# Patient Record
Sex: Female | Born: 1935 | Race: White | Hispanic: No | Smoking: Never smoker
Health system: Southern US, Community
[De-identification: ages and names within clinical notes are randomized; demographics above are authoritative.]

## PROBLEM LIST (undated history)

## (undated) DIAGNOSIS — I509 Heart failure, unspecified: Secondary | ICD-10-CM

## (undated) DIAGNOSIS — M810 Age-related osteoporosis without current pathological fracture: Secondary | ICD-10-CM

## (undated) DIAGNOSIS — M1990 Unspecified osteoarthritis, unspecified site: Secondary | ICD-10-CM

## (undated) DIAGNOSIS — I10 Essential (primary) hypertension: Secondary | ICD-10-CM

## (undated) DIAGNOSIS — E785 Hyperlipidemia, unspecified: Secondary | ICD-10-CM

## (undated) HISTORY — PX: REPLACEMENT TOTAL KNEE: SUR1224

## (undated) HISTORY — PX: CHOLECYSTECTOMY: SHX55

## (undated) HISTORY — PX: APPENDECTOMY: SHX54

## (undated) HISTORY — PX: CARPAL TUNNEL RELEASE: SHX101

---

## 2004-06-10 ENCOUNTER — Ambulatory Visit: Payer: Self-pay | Admitting: Unknown Physician Specialty

## 2004-06-17 ENCOUNTER — Ambulatory Visit: Payer: Self-pay | Admitting: Unknown Physician Specialty

## 2004-11-24 ENCOUNTER — Ambulatory Visit: Payer: Self-pay | Admitting: Internal Medicine

## 2004-12-23 ENCOUNTER — Ambulatory Visit: Payer: Self-pay | Admitting: Unknown Physician Specialty

## 2004-12-30 ENCOUNTER — Ambulatory Visit: Payer: Self-pay | Admitting: Unknown Physician Specialty

## 2005-11-20 ENCOUNTER — Ambulatory Visit: Payer: Self-pay

## 2005-12-07 ENCOUNTER — Ambulatory Visit: Payer: Self-pay | Admitting: Unknown Physician Specialty

## 2005-12-07 ENCOUNTER — Other Ambulatory Visit: Payer: Self-pay

## 2005-12-14 ENCOUNTER — Ambulatory Visit: Payer: Self-pay | Admitting: Unknown Physician Specialty

## 2006-06-11 ENCOUNTER — Other Ambulatory Visit: Payer: Self-pay

## 2006-06-19 ENCOUNTER — Inpatient Hospital Stay: Payer: Self-pay | Admitting: Unknown Physician Specialty

## 2006-09-06 ENCOUNTER — Ambulatory Visit: Payer: Self-pay | Admitting: Internal Medicine

## 2007-03-27 ENCOUNTER — Other Ambulatory Visit: Payer: Self-pay

## 2007-03-27 ENCOUNTER — Ambulatory Visit: Payer: Self-pay | Admitting: Ophthalmology

## 2007-03-28 ENCOUNTER — Ambulatory Visit: Payer: Self-pay | Admitting: Cardiology

## 2007-04-15 ENCOUNTER — Ambulatory Visit: Payer: Self-pay | Admitting: Ophthalmology

## 2007-12-26 ENCOUNTER — Ambulatory Visit: Payer: Self-pay | Admitting: Ophthalmology

## 2008-01-06 ENCOUNTER — Ambulatory Visit: Payer: Self-pay | Admitting: Ophthalmology

## 2009-01-20 ENCOUNTER — Ambulatory Visit: Payer: Self-pay | Admitting: Internal Medicine

## 2010-02-03 ENCOUNTER — Ambulatory Visit: Payer: Self-pay | Admitting: Internal Medicine

## 2011-03-02 ENCOUNTER — Ambulatory Visit: Payer: Self-pay | Admitting: Internal Medicine

## 2012-03-04 ENCOUNTER — Ambulatory Visit: Payer: Self-pay | Admitting: Internal Medicine

## 2013-03-05 ENCOUNTER — Ambulatory Visit: Payer: Self-pay | Admitting: Internal Medicine

## 2013-11-24 ENCOUNTER — Ambulatory Visit: Payer: Self-pay | Admitting: Ophthalmology

## 2013-12-11 ENCOUNTER — Ambulatory Visit: Payer: Self-pay | Admitting: Ophthalmology

## 2013-12-23 ENCOUNTER — Ambulatory Visit: Payer: Self-pay | Admitting: Vascular Surgery

## 2014-03-30 ENCOUNTER — Ambulatory Visit: Payer: Self-pay | Admitting: Internal Medicine

## 2014-10-22 ENCOUNTER — Other Ambulatory Visit: Payer: Self-pay | Admitting: Rheumatology

## 2014-10-22 ENCOUNTER — Ambulatory Visit
Admission: RE | Admit: 2014-10-22 | Discharge: 2014-10-22 | Disposition: A | Discharge status: Home / Self Care | Payer: Medicare Other | Source: Ambulatory Visit | Attending: Rheumatology | Admitting: Rheumatology

## 2014-10-22 DIAGNOSIS — M79604 Pain in right leg: Secondary | ICD-10-CM

## 2014-10-22 DIAGNOSIS — M7989 Other specified soft tissue disorders: Secondary | ICD-10-CM | POA: Diagnosis not present

## 2015-02-22 ENCOUNTER — Other Ambulatory Visit: Payer: Self-pay | Admitting: Internal Medicine

## 2015-02-22 DIAGNOSIS — Z1231 Encounter for screening mammogram for malignant neoplasm of breast: Secondary | ICD-10-CM

## 2015-04-01 ENCOUNTER — Other Ambulatory Visit: Payer: Self-pay | Admitting: Internal Medicine

## 2015-04-01 ENCOUNTER — Ambulatory Visit
Admission: RE | Admit: 2015-04-01 | Discharge: 2015-04-01 | Disposition: A | Discharge status: Home / Self Care | Payer: Medicare Other | Source: Ambulatory Visit | Attending: Internal Medicine | Admitting: Internal Medicine

## 2015-04-01 DIAGNOSIS — Z1231 Encounter for screening mammogram for malignant neoplasm of breast: Secondary | ICD-10-CM

## 2016-02-04 ENCOUNTER — Emergency Department: Payer: Medicare Other

## 2016-02-04 ENCOUNTER — Observation Stay
Admission: EM | Admit: 2016-02-04 | Discharge: 2016-02-08 | Disposition: A | Discharge status: Home / Self Care | Payer: Medicare Other | Attending: Family Medicine | Admitting: Family Medicine

## 2016-02-04 ENCOUNTER — Encounter: Payer: Self-pay | Admitting: Emergency Medicine

## 2016-02-04 DIAGNOSIS — M810 Age-related osteoporosis without current pathological fracture: Secondary | ICD-10-CM | POA: Insufficient documentation

## 2016-02-04 DIAGNOSIS — Z7982 Long term (current) use of aspirin: Secondary | ICD-10-CM | POA: Diagnosis not present

## 2016-02-04 DIAGNOSIS — Z9889 Other specified postprocedural states: Secondary | ICD-10-CM | POA: Diagnosis not present

## 2016-02-04 DIAGNOSIS — M1990 Unspecified osteoarthritis, unspecified site: Secondary | ICD-10-CM | POA: Diagnosis not present

## 2016-02-04 DIAGNOSIS — S42309A Unspecified fracture of shaft of humerus, unspecified arm, initial encounter for closed fracture: Secondary | ICD-10-CM | POA: Diagnosis present

## 2016-02-04 DIAGNOSIS — E785 Hyperlipidemia, unspecified: Secondary | ICD-10-CM | POA: Insufficient documentation

## 2016-02-04 DIAGNOSIS — I708 Atherosclerosis of other arteries: Secondary | ICD-10-CM | POA: Diagnosis not present

## 2016-02-04 DIAGNOSIS — Z96652 Presence of left artificial knee joint: Secondary | ICD-10-CM | POA: Insufficient documentation

## 2016-02-04 DIAGNOSIS — W010XXA Fall on same level from slipping, tripping and stumbling without subsequent striking against object, initial encounter: Secondary | ICD-10-CM | POA: Insufficient documentation

## 2016-02-04 DIAGNOSIS — Z833 Family history of diabetes mellitus: Secondary | ICD-10-CM | POA: Insufficient documentation

## 2016-02-04 DIAGNOSIS — Z79899 Other long term (current) drug therapy: Secondary | ICD-10-CM | POA: Insufficient documentation

## 2016-02-04 DIAGNOSIS — I509 Heart failure, unspecified: Secondary | ICD-10-CM | POA: Diagnosis not present

## 2016-02-04 DIAGNOSIS — Z9049 Acquired absence of other specified parts of digestive tract: Secondary | ICD-10-CM | POA: Diagnosis not present

## 2016-02-04 DIAGNOSIS — W19XXXA Unspecified fall, initial encounter: Secondary | ICD-10-CM

## 2016-02-04 DIAGNOSIS — S52209A Unspecified fracture of shaft of unspecified ulna, initial encounter for closed fracture: Secondary | ICD-10-CM | POA: Diagnosis present

## 2016-02-04 DIAGNOSIS — Z823 Family history of stroke: Secondary | ICD-10-CM | POA: Diagnosis not present

## 2016-02-04 DIAGNOSIS — S32599A Other specified fracture of unspecified pubis, initial encounter for closed fracture: Secondary | ICD-10-CM | POA: Diagnosis present

## 2016-02-04 DIAGNOSIS — S32512A Fracture of superior rim of left pubis, initial encounter for closed fracture: Secondary | ICD-10-CM | POA: Diagnosis not present

## 2016-02-04 DIAGNOSIS — S42201A Unspecified fracture of upper end of right humerus, initial encounter for closed fracture: Secondary | ICD-10-CM | POA: Diagnosis present

## 2016-02-04 DIAGNOSIS — I10 Essential (primary) hypertension: Secondary | ICD-10-CM | POA: Diagnosis present

## 2016-02-04 DIAGNOSIS — S52134A Nondisplaced fracture of neck of right radius, initial encounter for closed fracture: Secondary | ICD-10-CM | POA: Insufficient documentation

## 2016-02-04 DIAGNOSIS — S52001A Unspecified fracture of upper end of right ulna, initial encounter for closed fracture: Secondary | ICD-10-CM | POA: Diagnosis present

## 2016-02-04 DIAGNOSIS — I110 Hypertensive heart disease with heart failure: Secondary | ICD-10-CM | POA: Insufficient documentation

## 2016-02-04 DIAGNOSIS — R0602 Shortness of breath: Secondary | ICD-10-CM

## 2016-02-04 DIAGNOSIS — I272 Other secondary pulmonary hypertension: Secondary | ICD-10-CM | POA: Diagnosis not present

## 2016-02-04 DIAGNOSIS — S329XXA Fracture of unspecified parts of lumbosacral spine and pelvis, initial encounter for closed fracture: Secondary | ICD-10-CM

## 2016-02-04 HISTORY — DX: Essential (primary) hypertension: I10

## 2016-02-04 HISTORY — DX: Unspecified osteoarthritis, unspecified site: M19.90

## 2016-02-04 HISTORY — DX: Age-related osteoporosis without current pathological fracture: M81.0

## 2016-02-04 HISTORY — DX: Heart failure, unspecified: I50.9

## 2016-02-04 HISTORY — DX: Hyperlipidemia, unspecified: E78.5

## 2016-02-04 LAB — CBC WITH DIFFERENTIAL/PLATELET
BASOS ABS: 0 10*3/uL (ref 0–0.1)
Basophils Relative: 0 %
Eosinophils Absolute: 0 10*3/uL (ref 0–0.7)
Eosinophils Relative: 0 %
HEMATOCRIT: 35.8 % (ref 35.0–47.0)
HEMOGLOBIN: 12.4 g/dL (ref 12.0–16.0)
LYMPHS ABS: 0.5 10*3/uL — AB (ref 1.0–3.6)
LYMPHS PCT: 3 %
MCH: 29.6 pg (ref 26.0–34.0)
MCHC: 34.7 g/dL (ref 32.0–36.0)
MCV: 85.3 fL (ref 80.0–100.0)
Monocytes Absolute: 1 10*3/uL — ABNORMAL HIGH (ref 0.2–0.9)
Monocytes Relative: 6 %
NEUTROS ABS: 14 10*3/uL — AB (ref 1.4–6.5)
Neutrophils Relative %: 91 %
PLATELETS: 148 10*3/uL — AB (ref 150–440)
RBC: 4.2 MIL/uL (ref 3.80–5.20)
RDW: 14.9 % — ABNORMAL HIGH (ref 11.5–14.5)
WBC: 15.5 10*3/uL — AB (ref 3.6–11.0)

## 2016-02-04 LAB — BASIC METABOLIC PANEL
ANION GAP: 8 (ref 5–15)
BUN: 35 mg/dL — AB (ref 6–20)
CHLORIDE: 108 mmol/L (ref 101–111)
CO2: 24 mmol/L (ref 22–32)
Calcium: 8.9 mg/dL (ref 8.9–10.3)
Creatinine, Ser: 1.13 mg/dL — ABNORMAL HIGH (ref 0.44–1.00)
GFR calc Af Amer: 49 mL/min — ABNORMAL LOW (ref >60)
GFR, EST NON AFRICAN AMERICAN: 42 mL/min — AB (ref >60)
GLUCOSE: 183 mg/dL — AB (ref 65–99)
POTASSIUM: 3.8 mmol/L (ref 3.5–5.1)
Sodium: 140 mmol/L (ref 135–145)

## 2016-02-04 LAB — TYPE AND SCREEN
ABO/RH(D): A POS
Antibody Screen: NEGATIVE

## 2016-02-04 MED ORDER — MORPHINE SULFATE (PF) 2 MG/ML IV SOLN
2.0000 mg | Freq: Once | INTRAVENOUS | Status: AC
  Administered 2016-02-04: 2 mg via INTRAVENOUS
  Filled 2016-02-04: qty 1

## 2016-02-04 MED ORDER — MORPHINE SULFATE (PF) 4 MG/ML IV SOLN
INTRAVENOUS | Status: AC
  Administered 2016-02-04: 4 mg via INTRAVENOUS
  Filled 2016-02-04: qty 1

## 2016-02-04 MED ORDER — MORPHINE SULFATE (PF) 4 MG/ML IV SOLN
4.0000 mg | Freq: Once | INTRAVENOUS | Status: AC
  Administered 2016-02-04: 4 mg via INTRAVENOUS

## 2016-02-04 NOTE — ED Notes (Signed)
Pt back from x-ray.

## 2016-02-04 NOTE — ED Triage Notes (Signed)
Pt to triage via wheelchair. Pt reports she was in the yard when she slipped on the grass and fall landing on her right arm. Pt reports while getting herself off the ground she developed pain in her left groin. Pt reports pain to her right arm from the elbow up to the shoulder and in her left groin region. Pt denies LOC.

## 2016-02-04 NOTE — ED Provider Notes (Signed)
[facility] Emergency Department Provider Note  ____________________________________________   First MD Initiated Contact with Patient 02/04/16 2030     (approximate)  I have reviewed the triage vital signs and the nursing notes.   HISTORY  Chief Complaint Fall; Arm Injury; and Groin Pain   HPI Yvonne Knight is a 82 y.o. female with a history of CHF, hypertension and arthritis was presented to emergency department after fall. Said that she slipped on the grass in her yard and fell onto her right side. She denies hitting her head or losing consciousness. She is complaining of pain in her right shoulder and right elbow as well as her right forearm. She also is complaining of pain to her left pelvis. She says that she got up and was able to walk into her house after she fell. However, as time went on she began to have more pain and was unable to walk. She is now presenting to the emergency room with her son at her bedside. Says that aspirin is the only anticoagulant that she takes home.   Past Medical History:  Diagnosis Date  . Arthritis   . CHF (congestive heart failure) (HCC)   . Hypertension     There are no active problems to display for this patient.   History reviewed. No pertinent surgical history.  Prior to Admission medications   Not on File    Allergies Review of patient's allergies indicates no known allergies.  History reviewed. No pertinent family history.  Social History Social History  Substance Use Topics  . Smoking status: Never Smoker  . Smokeless tobacco: Never Used  . Alcohol use No    Review of Systems Constitutional: No fever/chills Eyes: No visual changes. ENT: No sore throat. Cardiovascular: Denies chest pain. Respiratory: Denies shortness of breath. Gastrointestinal: No abdominal pain.  No nausea, no vomiting.  No diarrhea.  No constipation. Genitourinary: Negative for dysuria. Musculoskeletal: Negative  for back pain. Skin: Negative for rash. Neurological: Negative for headaches, focal weakness or numbness.  10-point ROS otherwise negative.  ____________________________________________   PHYSICAL EXAM:  VITAL SIGNS: ED Triage Vitals  Enc Vitals Group     BP 02/04/16 1958 (!) 130/47     Pulse Rate 02/04/16 1958 88     Resp 02/04/16 1958 18     Temp 02/04/16 1958 98 F (36.7 C)     Temp Source 02/04/16 1958 Oral     SpO2 02/04/16 1958 99 %     Weight 02/04/16 1959 157 lb (71.2 kg)     Height 02/04/16 1959 4\' 11"  (1.499 m)     Head Circumference --      Peak Flow --      Pain Score 02/04/16 1959 10     Pain Loc --      Pain Edu? --      Excl. in GC? --     Constitutional: Alert and oriented. Well appearing and in no acute distress. Eyes: Conjunctivae are normal. PERRL. EOMI. Head: Atraumatic. Nose: No congestion/rhinnorhea. Mouth/Throat: Mucous membranes are moist.   Neck: No stridor.   Cardiovascular: Normal rate, regular rhythm. Grossly normal heart sounds.  Good peripheral circulation. Respiratory: Normal respiratory effort.  No retractions. Lungs CTAB. Gastrointestinal: Soft and nontender. No distention.  Musculoskeletal: Right shoulder with swelling and tenderness palpation. Sensation is intact to light touch. Left elbow tender diffusely without any palpable large effusion. Tenderness also down the right forearm with snuffbox tenderness as well as pain to  axial load of the right thumb. Patient with 5 out of 5 grip strength as well as sensation being intact to light touch and intact right-sided radial pulse. Pelvis stable. However, tenderness to palpation over the left anterior groin. Able to passively range the bilateral hips fully without any resistance or pain.  No lower extremity tenderness nor edema.  No joint effusions. Neurologic:  Normal speech and language. No gross focal neurologic deficits are appreciated. Skin:  Skin is warm, dry and intact. No rash  noted. Psychiatric: Mood and affect are normal. Speech and behavior are normal.  ____________________________________________   LABS (all labs ordered are listed, but only abnormal results are displayed)  Labs Reviewed  CBC WITH DIFFERENTIAL/PLATELET  BASIC METABOLIC PANEL  TYPE AND SCREEN   ____________________________________________  EKG   ____________________________________________  Purnell Shoemaker Forearm Right (Accession [id number]) (Order [id number])  Imaging  Date: 02/04/2016 Department: [facility] EMERGENCY DEPARTMENT Released By: Chelsea Aus, RN (auto-released) Authorizing: Myrna Blazer, MD  PACS Images   Show images for DG Forearm Right  Addendum   ADDENDUM REPORT: 02/04/2016 21:23  ADDENDUM: The fracture at the proximal metaphysis-diaphysis junction is in the proximal ulna. No humeral fracture evident. Old trauma involving the proximal radius with questionable superimposed fracture in the proximal radial metaphysis.   Electronically Signed   By: Bretta Bang III M.D.   On: 02/04/2016 21:23   Addended by Bretta Bang III, MD on 02/04/2016 9:26 PM    Study Result   CLINICAL DATA:  Pain following fall  EXAM: RIGHT FOREARM - 2 VIEW  COMPARISON:  None.  FINDINGS: Frontal and lateral views were obtained. There is an acute appearing fracture of the proximal humerus at the proximal metaphysis -diaphysis junction. There is evidence of old trauma with remodeling in the proximal radial metaphysis. There may be an acute fracture superimposed in this area. There is evidence of an apparent old fracture of the distal radial metaphysis. There is evidence of an old healed fracture of the distal radial metaphysis. No other fractures are evident. No dislocation. There is a cyst in the lunate bone. There is soft tissue swelling in the proximal forearm.  IMPRESSION: Acute fracture proximal radial metaphysis  -diaphysis junction. Old trauma proximal radial metaphysis with questionable superimposed acute fracture. Old healed fracture distal radius. Soft tissue swelling proximal forearm. No dislocation.  Electronically Signed: By: Bretta Bang III M.D. On: 02/04/2016 21:18    DG Hip Unilat With Pelvis 2-3 Views Left (Accession [id number]) (Order [id number])  Imaging  Date: 02/04/2016 Department: [facility] EMERGENCY DEPARTMENT Released By: Chelsea Aus, RN (auto-released) Authorizing: Myrna Blazer, MD  PACS Images   Show images for DG Hip Unilat With Pelvis 2-3 Views Left  Study Result   CLINICAL DATA:  Pain following fall  EXAM: DG HIP (WITH OR WITHOUT PELVIS) 2-3V LEFT  COMPARISON:  None.  FINDINGS: Frontal pelvis as well as frontal and lateral left hip images were obtained. There is a comminuted fracture of the left superior pubic ramus with fracture fragments in near anatomic alignment. No other fractures are evident. No dislocation. There is mild symmetric narrowing of both hip joints. Bones are osteoporotic. There are foci of atherosclerotic calcification in the superior femoral arteries bilaterally.  IMPRESSION: Comminuted fracture left superior pubic ramus with fracture fragments in near anatomic alignment. No other fractures are evident. No apparent dislocation. There is mild symmetric narrowing of both hip joints. Bones are diffusely osteoporotic. There is  superficial femoral artery atherosclerosis bilaterally.   Electronically Signed   By: Bretta Bang III M.D.   On: 02/04/2016 21:16  DG Humerus Right (Accession [id number]) (Order [id number])  Imaging  Date: 02/04/2016 Department: [facility] EMERGENCY DEPARTMENT Released By: Chelsea Aus, RN (auto-released) Authorizing: Myrna Blazer, MD  PACS Images   Show images for DG Humerus Right  Study Result   CLINICAL DATA:  Pain  following fall  EXAM: RIGHT HUMERUS - 2+ VIEW  COMPARISON:  None.  FINDINGS: Frontal and lateral views obtained. There is an acute fracture of the proximal ulnar metaphysis-diaphysis junction. Alignment appears near anatomic. There is evidence of old trauma with probable acute superimposed fracture in the proximal radial metaphysis. There is an impacted fracture of the proximal humeral metaphysis. No other fractures. No dislocations. There is moderate osteoarthritic change in the shoulder and elbow joints.  IMPRESSION: Impacted fracture of the proximal right humeral metaphysis. Fractures in the proximal forearm. Moderate osteoarthritic change in the elbow and right shoulder joints. Bones appear somewhat osteoporotic.   Electronically Signed   By: Bretta Bang III M.D.   On: 02/04/2016 21:20       ____________________________________________   PROCEDURES  Procedure(s) performed:   Procedures  Critical Care performed:   ____________________________________________   INITIAL IMPRESSION / ASSESSMENT AND PLAN / ED COURSE  Pertinent labs & imaging results that were available during my care of the patient were reviewed by me and considered in my medical decision making (see chart for details).  ----------------------------------------- 10:09 PM on 02/04/2016 -----------------------------------------  Patient with multiple fractures. Discussed with Dr. Trilby Drummer of orthopedics. I discussed with the with him including a sugar tong as well as spica splint of the right forearm and a sling to immobilize the right shoulder. Pending dedicated right wrist films at this time as well. I reviewed the imaging results with Dr. Trilby Drummer and he is questioning whether any of the fractures would require any surgical fixation. He recommends admission to the medicine service. He will see the patient has a consult. This plan with patient as well as the patient's son is at the  bedside. They're understanding of the plan and willing to comply. Patient to be given morphine for what she is describing is 10 out of 10 pain right now. She is not showing any signs of acute distress and is calm when I'm interviewing her. Signed over Dr. Anne Hahn.  Clinical Course     ____________________________________________   FINAL CLINICAL IMPRESSION(S) / ED DIAGNOSES  Right proximal humerus fracture. Right ulnar fracture. Left-sided pubic ramus fracture.    NEW MEDICATIONS STARTED DURING THIS VISIT:  New Prescriptions   No medications on file     Note:  This document was prepared using Dragon voice recognition software and may include unintentional dictation errors.    Myrna Blazer, MD 02/04/16 [phone]

## 2016-02-05 ENCOUNTER — Encounter: Payer: Self-pay | Admitting: Internal Medicine

## 2016-02-05 DIAGNOSIS — S42309A Unspecified fracture of shaft of humerus, unspecified arm, initial encounter for closed fracture: Secondary | ICD-10-CM | POA: Diagnosis present

## 2016-02-05 DIAGNOSIS — S32599A Other specified fracture of unspecified pubis, initial encounter for closed fracture: Secondary | ICD-10-CM | POA: Diagnosis present

## 2016-02-05 DIAGNOSIS — W19XXXA Unspecified fall, initial encounter: Secondary | ICD-10-CM

## 2016-02-05 DIAGNOSIS — S52001A Unspecified fracture of upper end of right ulna, initial encounter for closed fracture: Secondary | ICD-10-CM | POA: Diagnosis not present

## 2016-02-05 DIAGNOSIS — I10 Essential (primary) hypertension: Secondary | ICD-10-CM | POA: Diagnosis present

## 2016-02-05 DIAGNOSIS — S52209A Unspecified fracture of shaft of unspecified ulna, initial encounter for closed fracture: Secondary | ICD-10-CM | POA: Diagnosis present

## 2016-02-05 LAB — BASIC METABOLIC PANEL
Anion gap: 5 (ref 5–15)
BUN: 31 mg/dL — AB (ref 6–20)
CHLORIDE: 107 mmol/L (ref 101–111)
CO2: 28 mmol/L (ref 22–32)
Calcium: 8.7 mg/dL — ABNORMAL LOW (ref 8.9–10.3)
Creatinine, Ser: 0.98 mg/dL (ref 0.44–1.00)
GFR calc Af Amer: 58 mL/min — ABNORMAL LOW (ref >60)
GFR calc non Af Amer: 50 mL/min — ABNORMAL LOW (ref >60)
Glucose, Bld: 178 mg/dL — ABNORMAL HIGH (ref 65–99)
POTASSIUM: 3.9 mmol/L (ref 3.5–5.1)
SODIUM: 140 mmol/L (ref 135–145)

## 2016-02-05 LAB — CBC
HEMATOCRIT: 34.1 % — AB (ref 35.0–47.0)
Hemoglobin: 12 g/dL (ref 12.0–16.0)
MCH: 29.8 pg (ref 26.0–34.0)
MCHC: 35.2 g/dL (ref 32.0–36.0)
MCV: 84.7 fL (ref 80.0–100.0)
Platelets: 140 10*3/uL — ABNORMAL LOW (ref 150–440)
RBC: 4.03 MIL/uL (ref 3.80–5.20)
RDW: 14.9 % — AB (ref 11.5–14.5)
WBC: 10 10*3/uL (ref 3.6–11.0)

## 2016-02-05 MED ORDER — MORPHINE SULFATE (PF) 4 MG/ML IV SOLN
4.0000 mg | INTRAVENOUS | Status: DC | PRN
  Administered 2016-02-05: 4 mg via INTRAVENOUS
  Filled 2016-02-05: qty 1

## 2016-02-05 MED ORDER — AMLODIPINE BESYLATE 5 MG PO TABS
10.0000 mg | ORAL_TABLET | Freq: Every day | ORAL | Status: DC
  Administered 2016-02-05 – 2016-02-08 (×4): 10 mg via ORAL
  Filled 2016-02-05 (×4): qty 2

## 2016-02-05 MED ORDER — ENOXAPARIN SODIUM 40 MG/0.4ML ~~LOC~~ SOLN
40.0000 mg | SUBCUTANEOUS | Status: DC
  Administered 2016-02-05 – 2016-02-07 (×3): 40 mg via SUBCUTANEOUS
  Filled 2016-02-05 (×3): qty 0.4

## 2016-02-05 MED ORDER — HYDROXYCHLOROQUINE SULFATE 200 MG PO TABS
200.0000 mg | ORAL_TABLET | Freq: Every day | ORAL | Status: DC
  Administered 2016-02-05 – 2016-02-08 (×4): 200 mg via ORAL
  Filled 2016-02-05 (×4): qty 1

## 2016-02-05 MED ORDER — ACETAMINOPHEN 650 MG RE SUPP: 650.0000 mg | Freq: Four times a day (QID) | RECTAL | Status: DC | PRN

## 2016-02-05 MED ORDER — SODIUM CHLORIDE 0.9 % IV SOLN
INTRAVENOUS | Status: AC
  Administered 2016-02-05: 02:00:00 via INTRAVENOUS

## 2016-02-05 MED ORDER — ONDANSETRON HCL 4 MG PO TABS: 4.0000 mg | ORAL_TABLET | Freq: Four times a day (QID) | ORAL | Status: DC | PRN

## 2016-02-05 MED ORDER — FUROSEMIDE 20 MG PO TABS
20.0000 mg | ORAL_TABLET | Freq: Every day | ORAL | Status: DC
  Administered 2016-02-05 – 2016-02-08 (×4): 20 mg via ORAL
  Filled 2016-02-05 (×4): qty 1

## 2016-02-05 MED ORDER — OXYCODONE HCL 5 MG PO TABS
5.0000 mg | ORAL_TABLET | ORAL | Status: DC | PRN
  Administered 2016-02-05 (×2): 5 mg via ORAL
  Filled 2016-02-05 (×2): qty 1

## 2016-02-05 MED ORDER — ACETAMINOPHEN 325 MG PO TABS
650.0000 mg | ORAL_TABLET | Freq: Four times a day (QID) | ORAL | Status: DC | PRN
Start: 2016-02-05 — End: 2016-02-08

## 2016-02-05 MED ORDER — MORPHINE SULFATE (PF) 2 MG/ML IV SOLN: 2.0000 mg | INTRAVENOUS | Status: DC | PRN

## 2016-02-05 MED ORDER — OXYCODONE HCL 5 MG PO TABS
5.0000 mg | ORAL_TABLET | ORAL | Status: DC | PRN
  Administered 2016-02-05 (×2): 5 mg via ORAL
  Administered 2016-02-06 – 2016-02-08 (×6): 10 mg via ORAL
  Administered 2016-02-08: 5 mg via ORAL
  Administered 2016-02-08: 10 mg via ORAL
  Filled 2016-02-05 (×3): qty 2
  Filled 2016-02-05 (×4): qty 1
  Filled 2016-02-05: qty 2
  Filled 2016-02-05: qty 1
  Filled 2016-02-05 (×2): qty 2

## 2016-02-05 MED ORDER — ONDANSETRON HCL 4 MG/2ML IJ SOLN: 4.0000 mg | Freq: Four times a day (QID) | INTRAMUSCULAR | Status: DC | PRN

## 2016-02-05 MED ORDER — ENOXAPARIN SODIUM 40 MG/0.4ML ~~LOC~~ SOLN: 40.0000 mg | SUBCUTANEOUS | Status: DC

## 2016-02-05 MED ORDER — ENOXAPARIN SODIUM 30 MG/0.3ML ~~LOC~~ SOLN: 30.0000 mg | SUBCUTANEOUS | Status: DC

## 2016-02-05 MED ORDER — LOSARTAN POTASSIUM 50 MG PO TABS
50.0000 mg | ORAL_TABLET | Freq: Every day | ORAL | Status: DC
  Administered 2016-02-05 – 2016-02-08 (×4): 50 mg via ORAL
  Filled 2016-02-05 (×4): qty 1

## 2016-02-05 MED ORDER — ASPIRIN EC 325 MG PO TBEC
325.0000 mg | DELAYED_RELEASE_TABLET | Freq: Every day | ORAL | Status: DC
  Administered 2016-02-05 – 2016-02-08 (×4): 325 mg via ORAL
  Filled 2016-02-05 (×4): qty 1

## 2016-02-05 NOTE — Consult Note (Signed)
82 year old female fell at home last night she tripped over something and landed on her right side and left side. Her right upper extremity was injured and had x-rays in the emergency room and also suffered left-sided pelvic fracture. 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 She has not a pain with logrolling on the left lower leg motion was not tested in the right upper extremity secondary to fractures  X-rays were reviewed. There is nondisplaced fractures of the proximal ulna and radial neck as well as a proximal humerus fracture that appears impacted on the right side. On the left side there is a superior pubic ramus fracture as well without extension into the joint.  Clinical impression is multiple fractures both right upper and left lower extremities  Plan physical therapy's been requested to need to stay nonweightbearing on her right upper extremity for at least 6 weeks. She will prior require short-term rehabilitation to be able to manage and get mobilized with these fractures

## 2016-02-05 NOTE — Progress Notes (Signed)
Verbal order by MD Rosita Kea, to change IV Morphine Q4 PRN  to 2 mg, and oxycodone 5-10 mg Q 4 PRN

## 2016-02-05 NOTE — Clinical Social Work Note (Addendum)
Clinical Social Work Assessment  Patient Details  Name: Yvonne Knight MRN: [id number] Date of Birth: 01/13/1926  Date of referral:  02/05/16               Reason for consult:  Facility Placement                Permission sought to share information with:  Oceanographer granted to share information::  Yes, Verbal Permission Granted  Name::        Agency::     Relationship::     Contact Information:     Housing/Transportation Living arrangements for the past 2 months:  Single Family Home Source of Information:  Patient, Adult Children Patient Interpreter Needed:  None Criminal Activity/Legal Involvement Pertinent to Current Situation/Hospitalization:  No - Comment as needed Significant Relationships:  Adult Children, Community Support Lives with:  Self Do you feel safe going back to the place where you live?  Yes Need for family participation in patient care:  No (Coment)  Care giving concerns:  STR placement, Placement preference in [facility]   Social Worker assessment / plan:  Patient was resting comfortable, and her son Joni Fears and his wife were at bedside. The CSW introduced self and role in care. The patient's son indicated that the preference for STR placement is [facility] in [place], with no secondary preference indicated. The CSW explained that a referral would be sent to all locations in [place] in case no beds available at [facility]. Patient's family indicated that they understood, and the patient and family gave verbal permission for a bed search.   At baseline, the patient is independent in all ADLs and IADLs and lives alone in a single family home. The patient has used [facility] for a past need due to a knee replacement. From that rehab stint, the patient still has a cane and walker as DME.  CSW attempted to contact [facility] for referral information and was told to call the next day. In addition, the patient has a PASRR, but her information is  incorrect in the [facility] MUST system. Primary CSW will be informed to rectify when able to do so during regular business hours.  Employment status:  Retired Health and safety inspector:  Medicare PT Recommendations:  Not assessed at this time Information / Referral to community resources:  Skilled Nursing Facility  Patient/Family's Response to care:  Family was involved and thanked CSW for her assistance.  Patient/Family's Understanding of and Emotional Response to Diagnosis, Current Treatment, and Prognosis:  Patient began showing signs of medication induced confusion. The family members were able to verbalize in their own terms the dc plan and care needs.  Emotional Assessment Appearance:  Appears stated age Attitude/Demeanor/Rapport:  Lethargic, Other (Patient showing signs of medication induced confusion) Affect (typically observed):  Accepting, Appropriate, Happy Orientation:  Oriented to Self, Oriented to Place, Oriented to  Time, Oriented to Situation Alcohol / Substance use:  Never Used Psych involvement (Current and /or in the community):  No (Comment)  Discharge Needs  Concerns to be addressed:  Discharge Planning Concerns Readmission within the last 30 days:  No Current discharge risk:  None Barriers to Discharge:  Continued Medical Work up   [employer], LCSW 02/05/2016, 3:42 PM

## 2016-02-05 NOTE — Progress Notes (Signed)
Subjective: Complains of right arm pain.  Objective: Vital signs in last 24 hours: Temp:  [98 F (36.7 C)-98.7 F (37.1 C)] 98.7 F (37.1 C) (09/23 0722) Pulse Rate:  [77-88] 85 (09/23 0722) Resp:  [17-18] 18 (09/23 0722) BP: (112-144)/(43-63) 137/52 (09/23 0722) SpO2:  [94 %-99 %] 97 % (09/23 0722) Weight:  [71.2 kg (157 lb)-71.7 kg (158 lb)] 71.7 kg (158 lb) (09/23 0500) Weight change:  Last BM Date: 02/04/16  Intake/Output from previous day: 09/22 0701 - 09/23 0700 In: 70 [I.V.:70] Out: -  Intake/Output this shift: No intake/output data recorded.  General appearance: alert, cooperative and no distress Resp: clear to auscultation bilaterally and normal percussion bilaterally Cardio: regular rate and rhythm, S1, S2 normal, no murmur, click, rub or gallop Extremities: Right arm in sling and splint  Lab Results:  Recent Labs  02/04/16 2152 02/05/16 0335  WBC 15.5* 10.0  HGB 12.4 12.0  HCT 35.8 34.1*  PLT 148* 140*   BMET  Recent Labs  02/04/16 2152 02/05/16 0335  NA 140 140  K 3.8 3.9  CL 108 107  CO2 24 28  GLUCOSE 183* 178*  BUN 35* 31*  CREATININE 1.13* 0.98  CALCIUM 8.9 8.7*    Studies/Results: Dg Forearm Right  Addendum Date: 02/04/2016   ADDENDUM REPORT: 02/04/2016 21:23 ADDENDUM: The fracture at the proximal metaphysis-diaphysis junction is in the proximal ulna. No humeral fracture evident. Old trauma involving the proximal radius with questionable superimposed fracture in the proximal radial metaphysis. Electronically Signed   By: Bretta Bang III M.D.   On: 02/04/2016 21:23   Result Date: 02/04/2016 CLINICAL DATA:  Pain following fall EXAM: RIGHT FOREARM - 2 VIEW COMPARISON:  None. FINDINGS: Frontal and lateral views were obtained. There is an acute appearing fracture of the proximal humerus at the proximal metaphysis -diaphysis junction. There is evidence of old trauma with remodeling in the proximal radial metaphysis. There may be an acute  fracture superimposed in this area. There is evidence of an apparent old fracture of the distal radial metaphysis. There is evidence of an old healed fracture of the distal radial metaphysis. No other fractures are evident. No dislocation. There is a cyst in the lunate bone. There is soft tissue swelling in the proximal forearm. IMPRESSION: Acute fracture proximal radial metaphysis -diaphysis junction. Old trauma proximal radial metaphysis with questionable superimposed acute fracture. Old healed fracture distal radius. Soft tissue swelling proximal forearm. No dislocation. Electronically Signed: By: Bretta Bang III M.D. On: 02/04/2016 21:18   Dg Wrist Complete Right  Result Date: 02/04/2016 CLINICAL DATA:  Wrist pain after fall. EXAM: RIGHT WRIST - COMPLETE 3+ VIEW COMPARISON:  None. (RIGHT hand report dated November 03, 2013 reviewed though images were unable to be obtained) FINDINGS: No acute fracture deformity or dislocation. Old first metacarpal fracture deformity. Moderate first metacarpal carpal joint space narrowing, periventricular sclerosis consistent with osteoarthrosis. Osteopenia. No destructive bony lesions. Soft tissue planes are nonsuspicious. IMPRESSION: No acute fracture deformity or dislocation. Please note, osteopenia decreases sensitivity for acute nondisplaced fractures. Old first metacarpal fracture. Electronically Signed   By: Awilda Metro M.D.   On: 02/04/2016 22:46   Dg Humerus Right  Result Date: 02/04/2016 CLINICAL DATA:  Pain following fall EXAM: RIGHT HUMERUS - 2+ VIEW COMPARISON:  None. FINDINGS: Frontal and lateral views obtained. There is an acute fracture of the proximal ulnar metaphysis-diaphysis junction. Alignment appears near anatomic. There is evidence of old trauma with probable acute superimposed fracture in the proximal  radial metaphysis. There is an impacted fracture of the proximal humeral metaphysis. No other fractures. No dislocations. There is moderate  osteoarthritic change in the shoulder and elbow joints. IMPRESSION: Impacted fracture of the proximal right humeral metaphysis. Fractures in the proximal forearm. Moderate osteoarthritic change in the elbow and right shoulder joints. Bones appear somewhat osteoporotic. Electronically Signed   By: Bretta Bang III M.D.   On: 02/04/2016 21:20   Dg Hip Unilat With Pelvis 2-3 Views Left  Result Date: 02/04/2016 CLINICAL DATA:  Pain following fall EXAM: DG HIP (WITH OR WITHOUT PELVIS) 2-3V LEFT COMPARISON:  None. FINDINGS: Frontal pelvis as well as frontal and lateral left hip images were obtained. There is a comminuted fracture of the left superior pubic ramus with fracture fragments in near anatomic alignment. No other fractures are evident. No dislocation. There is mild symmetric narrowing of both hip joints. Bones are osteoporotic. There are foci of atherosclerotic calcification in the superior femoral arteries bilaterally. IMPRESSION: Comminuted fracture left superior pubic ramus with fracture fragments in near anatomic alignment. No other fractures are evident. No apparent dislocation. There is mild symmetric narrowing of both hip joints. Bones are diffusely osteoporotic. There is superficial femoral artery atherosclerosis bilaterally. Electronically Signed   By: Bretta Bang III M.D.   On: 02/04/2016 21:16    Medications: I have reviewed the patient's current medications.  Assessment/Plan: 1.Humerus fracture - Ortho consulted. Pain meds and splint for now.  2. Pubic ramus fracture -PT as soon as can tolerate. 3. Ulnar fracture - as above 4. HTN  - Controlled on home meds home meds  Time spent 20 min  LOS: 0 days   Gracelyn Nurse 02/05/2016, 11:37 AM

## 2016-02-05 NOTE — H&P (Signed)
[facility] - [place] at [facility]   PATIENT NAME: Yvonne Knight    MR#:  [id number]  DATE OF BIRTH:  [birth year]  DATE OF ADMISSION:  02/04/2016  PRIMARY CARE PHYSICIAN: Barbette Reichmann, MD   REQUESTING/REFERRING PHYSICIAN: Pershing Proud, MD  CHIEF COMPLAINT:   Chief Complaint  Patient presents with  . Fall  . Arm Injury  . Groin Pain    HISTORY OF PRESENT ILLNESS:  Yvonne Knight  is a 82 y.o. female who presents with Fall and subsequent multiple fractures. Patient states that she was in her yard this afternoon and she slipped, had a mechanical fall, and ended up fracturing her right ulna, right humerus, and pelvis. Orthopedics was called from the ED, stated preliminarily that they did not feel she would need surgery, but that they would consult in the morning to make official recommendations. Hospitalists were called for admission.  PAST MEDICAL HISTORY:   Past Medical History:  Diagnosis Date  . Arthritis   . CHF (congestive heart failure) (HCC)   . HLD (hyperlipidemia)   . Hypertension   . Osteoporosis     PAST SURGICAL HISTORY:   Past Surgical History:  Procedure Laterality Date  . APPENDECTOMY    . CARPAL TUNNEL RELEASE    . CHOLECYSTECTOMY    . REPLACEMENT TOTAL KNEE Left     SOCIAL HISTORY:   Social History  Substance Use Topics  . Smoking status: Never Smoker  . Smokeless tobacco: Never Used  . Alcohol use No    FAMILY HISTORY:   Family History  Problem Relation Age of Onset  . Stroke Mother   . Diabetes Sister     DRUG ALLERGIES:  No Known Allergies  MEDICATIONS AT HOME:   Prior to Admission medications   Medication Sig Start Date End Date Taking? Authorizing Provider  amLODipine (NORVASC) 10 MG tablet Take 10 mg by mouth daily.   Yes Historical Provider, MD  aspirin 325 MG EC tablet Take 325 mg by mouth daily.   Yes Historical Provider, MD  Calcium Carb-Cholecalciferol (CALCIUM 500 +D) 500-400 MG-UNIT TABS Take 1  tablet by mouth 2 (two) times daily.   Yes Historical Provider, MD  furosemide (LASIX) 20 MG tablet Take 20 mg by mouth daily.   Yes Historical Provider, MD  hydroxychloroquine (PLAQUENIL) 200 MG tablet Take 200 mg by mouth daily.   Yes Historical Provider, MD  losartan (COZAAR) 50 MG tablet Take 50 mg by mouth daily.   Yes Historical Provider, MD  meclizine (ANTIVERT) 25 MG tablet Take 25 mg by mouth 2 (two) times daily.   Yes Historical Provider, MD  potassium chloride (K-DUR) 10 MEQ tablet Take 10 mEq by mouth daily.   Yes Historical Provider, MD    REVIEW OF SYSTEMS:  Review of Systems  Constitutional: Negative for chills, fever, malaise/fatigue and weight loss.  HENT: Negative for ear pain, hearing loss and tinnitus.   Eyes: Negative for blurred vision, double vision, pain and redness.  Respiratory: Negative for cough, hemoptysis and shortness of breath.   Cardiovascular: Negative for chest pain, palpitations, orthopnea and leg swelling.  Gastrointestinal: Negative for abdominal pain, constipation, diarrhea, nausea and vomiting.  Genitourinary: Negative for dysuria, frequency and hematuria.  Musculoskeletal: Positive for falls and joint pain. Negative for back pain and neck pain.  Skin:       No acne, rash, or lesions  Neurological: Negative for dizziness, tremors, focal weakness and weakness.  Endo/Heme/Allergies: Negative for polydipsia. Does not bruise/bleed easily.  Psychiatric/Behavioral: Negative for depression. The patient is not nervous/anxious and does not have insomnia.      VITAL SIGNS:   Vitals:   02/04/16 1958 02/04/16 1959 02/04/16 2130  BP: (!) 130/47  (!) 112/43  Pulse: 88  80  Resp: 18    Temp: 98 F (36.7 C)    TempSrc: Oral    SpO2: 99%  98%  Weight:  71.2 kg (157 lb)   Height:  4\' 11"  (1.499 m)    Wt Readings from Last 3 Encounters:  02/04/16 71.2 kg (157 lb)    PHYSICAL EXAMINATION:  Physical Exam  Vitals reviewed. Constitutional: She is  oriented to person, place, and time. She appears well-developed and well-nourished. No distress.  HENT:  Head: Normocephalic and atraumatic.  Mouth/Throat: Oropharynx is clear and moist.  Eyes: Conjunctivae and EOM are normal. Pupils are equal, round, and reactive to light. No scleral icterus.  Neck: Normal range of motion. Neck supple. No JVD present. No thyromegaly present.  Cardiovascular: Normal rate, regular rhythm and intact distal pulses.  Exam reveals no gallop and no friction rub.   No murmur heard. Respiratory: Effort normal and breath sounds normal. No respiratory distress. She has no wheezes. She has no rales.  GI: Soft. Bowel sounds are normal. She exhibits no distension. There is no tenderness.  Musculoskeletal: She exhibits no edema.  Right arm in sling, right upper extremity tenderness over her humerus and ulna corresponding to her fractures on x-ray, tenderness over her pelvic region as well.  Lymphadenopathy:    She has no cervical adenopathy.  Neurological: She is alert and oriented to person, place, and time. No cranial nerve deficit.  No dysarthria, no aphasia  Skin: Skin is warm and dry. No rash noted. No erythema.  Psychiatric: She has a normal mood and affect. Her behavior is normal. Judgment and thought content normal.    LABORATORY PANEL:   CBC  Recent Labs Lab 02/04/16 2152  WBC 15.5*  HGB 12.4  HCT 35.8  PLT 148*   ------------------------------------------------------------------------------------------------------------------  Chemistries   Recent Labs Lab 02/04/16 2152  NA 140  K 3.8  CL 108  CO2 24  GLUCOSE 183*  BUN 35*  CREATININE 1.13*  CALCIUM 8.9   ------------------------------------------------------------------------------------------------------------------  Cardiac Enzymes No results for input(s): TROPONINI in the last 168  hours. ------------------------------------------------------------------------------------------------------------------  RADIOLOGY:  Dg Forearm Right  Addendum Date: 02/04/2016   ADDENDUM REPORT: 02/04/2016 21:23 ADDENDUM: The fracture at the proximal metaphysis-diaphysis junction is in the proximal ulna. No humeral fracture evident. Old trauma involving the proximal radius with questionable superimposed fracture in the proximal radial metaphysis. Electronically Signed   By: 02/06/2016 III M.D.   On: 02/04/2016 21:23   Result Date: 02/04/2016 CLINICAL DATA:  Pain following fall EXAM: RIGHT FOREARM - 2 VIEW COMPARISON:  None. FINDINGS: Frontal and lateral views were obtained. There is an acute appearing fracture of the proximal humerus at the proximal metaphysis -diaphysis junction. There is evidence of old trauma with remodeling in the proximal radial metaphysis. There may be an acute fracture superimposed in this area. There is evidence of an apparent old fracture of the distal radial metaphysis. There is evidence of an old healed fracture of the distal radial metaphysis. No other fractures are evident. No dislocation. There is a cyst in the lunate bone. There is soft tissue swelling in the proximal forearm. IMPRESSION: Acute fracture proximal radial metaphysis -diaphysis junction. Old trauma proximal radial metaphysis with questionable superimposed acute fracture. Old  healed fracture distal radius. Soft tissue swelling proximal forearm. No dislocation. Electronically Signed: By: Bretta Bang III M.D. On: 02/04/2016 21:18   Dg Wrist Complete Right  Result Date: 02/04/2016 CLINICAL DATA:  Wrist pain after fall. EXAM: RIGHT WRIST - COMPLETE 3+ VIEW COMPARISON:  None. (RIGHT hand report dated November 03, 2013 reviewed though images were unable to be obtained) FINDINGS: No acute fracture deformity or dislocation. Old first metacarpal fracture deformity. Moderate first metacarpal carpal joint  space narrowing, periventricular sclerosis consistent with osteoarthrosis. Osteopenia. No destructive bony lesions. Soft tissue planes are nonsuspicious. IMPRESSION: No acute fracture deformity or dislocation. Please note, osteopenia decreases sensitivity for acute nondisplaced fractures. Old first metacarpal fracture. Electronically Signed   By: Awilda Metro M.D.   On: 02/04/2016 22:46   Dg Humerus Right  Result Date: 02/04/2016 CLINICAL DATA:  Pain following fall EXAM: RIGHT HUMERUS - 2+ VIEW COMPARISON:  None. FINDINGS: Frontal and lateral views obtained. There is an acute fracture of the proximal ulnar metaphysis-diaphysis junction. Alignment appears near anatomic. There is evidence of old trauma with probable acute superimposed fracture in the proximal radial metaphysis. There is an impacted fracture of the proximal humeral metaphysis. No other fractures. No dislocations. There is moderate osteoarthritic change in the shoulder and elbow joints. IMPRESSION: Impacted fracture of the proximal right humeral metaphysis. Fractures in the proximal forearm. Moderate osteoarthritic change in the elbow and right shoulder joints. Bones appear somewhat osteoporotic. Electronically Signed   By: Bretta Bang III M.D.   On: 02/04/2016 21:20   Dg Hip Unilat With Pelvis 2-3 Views Left  Result Date: 02/04/2016 CLINICAL DATA:  Pain following fall EXAM: DG HIP (WITH OR WITHOUT PELVIS) 2-3V LEFT COMPARISON:  None. FINDINGS: Frontal pelvis as well as frontal and lateral left hip images were obtained. There is a comminuted fracture of the left superior pubic ramus with fracture fragments in near anatomic alignment. No other fractures are evident. No dislocation. There is mild symmetric narrowing of both hip joints. Bones are osteoporotic. There are foci of atherosclerotic calcification in the superior femoral arteries bilaterally. IMPRESSION: Comminuted fracture left superior pubic ramus with fracture fragments in  near anatomic alignment. No other fractures are evident. No apparent dislocation. There is mild symmetric narrowing of both hip joints. Bones are diffusely osteoporotic. There is superficial femoral artery atherosclerosis bilaterally. Electronically Signed   By: Bretta Bang III M.D.   On: 02/04/2016 21:16    EKG:   Orders placed or performed in visit on 03/27/07  . EKG 12-Lead    IMPRESSION AND PLAN:  Principal Problem:   Fall with injury - follows mechanical with no loss of consciousness or other neurologic symptoms associated. Active Problems:   Humerus fracture - right upper extremity is in a sling, orthopedics consult ordered, pain control when necessary   Pubic ramus fracture (HCC) - as above   Ulnar fracture - as above   HTN (hypertension) - continue home meds  All the records are reviewed and case discussed with ED provider. Management plans discussed with the patient and/or family.  DVT PROPHYLAXIS: SubQ lovenox  GI PROPHYLAXIS: None  ADMISSION STATUS: Observation  CODE STATUS: Full Code Status History    This patient does not have a recorded code status. Please follow your organizational policy for patients in this situation.    Advance Directive Documentation   Flowsheet Row Most Recent Value  Type of Advance Directive  Healthcare Power of Attorney, Living will  Pre-existing out of facility DNR order (  yellow form or pink MOST form)  No data  "MOST" Form in Place?  No data      TOTAL TIME TAKING CARE OF THIS PATIENT: 40 minutes.    Chalisa Kobler FIELDING 02/05/2016, 12:27 AM  [facility]  Office  [phone]  CC: Primary care physician; Barbette Reichmann, MD

## 2016-02-05 NOTE — ED Notes (Signed)
Report given to floor RN. Pt taken to floor via stretcher. Vital signs stable prior to transport.  

## 2016-02-05 NOTE — NC FL2 (Signed)
[place] MEDICAID FL2 LEVEL OF CARE SCREENING TOOL     IDENTIFICATION  Patient Name: Yvonne Knight Birthdate: [birth year] Sex: female Admission Date (Current Location): 02/04/2016  [place] and [place] Number:  Chiropodist and Address:  [facility], [address] [place], [place] [address]      Provider Number: [id number]  Attending Physician Name and Address:  Gracelyn Nurse, MD  Relative Name and Phone Number:       Current Level of Care: Hospital Recommended Level of Care: Skilled Nursing Facility Prior Approval Number:    Date Approved/Denied:   PASRR Number:    Discharge Plan: SNF    Current Diagnoses: Patient Active Problem List   Diagnosis Date Noted  . Humerus fracture 02/05/2016  . Pubic ramus fracture (HCC) 02/05/2016  . Ulnar fracture 02/05/2016  . HTN (hypertension) 02/05/2016  . Fall with injury 02/05/2016    Orientation RESPIRATION BLADDER Height & Weight     Self, Time, Situation  Normal Continent Weight: 158 lb (71.7 kg) Height:  4\' 11"  (149.9 cm)  BEHAVIORAL SYMPTOMS/MOOD NEUROLOGICAL BOWEL NUTRITION STATUS      Continent    AMBULATORY STATUS COMMUNICATION OF NEEDS Skin   Total Care Verbally Surgical wounds                       Personal Care Assistance Level of Assistance  Bathing, Dressing Bathing Assistance: Maximum assistance Feeding assistance: Independent Dressing Assistance: Maximum assistance     Functional Limitations Info             SPECIAL CARE FACTORS FREQUENCY  PT (By licensed PT)     PT Frequency: 5X day 5X week              Contractures Contractures Info: Present    Additional Factors Info                  Current Medications (02/05/2016):  This is the current hospital active medication list Current Facility-Administered Medications  Medication Dose Route Frequency Provider Last Rate Last Dose  . acetaminophen (TYLENOL) tablet 650 mg  650 mg Oral Q6H  PRN 02/07/2016, MD       Or  . acetaminophen (TYLENOL) suppository 650 mg  650 mg Rectal Q6H PRN Oralia Manis, MD      . amLODipine (NORVASC) tablet 10 mg  10 mg Oral Daily Oralia Manis, MD   10 mg at 02/05/16 1306  . aspirin EC tablet 325 mg  325 mg Oral Daily 02/07/16, MD   325 mg at 02/05/16 1312  . enoxaparin (LOVENOX) injection 40 mg  40 mg Subcutaneous Q24H 02/07/16, MD      . furosemide (LASIX) tablet 20 mg  20 mg Oral Daily Oralia Manis, MD   20 mg at 02/05/16 1312  . hydroxychloroquine (PLAQUENIL) tablet 200 mg  200 mg Oral Daily 02/07/16, MD   200 mg at 02/05/16 1306  . losartan (COZAAR) tablet 50 mg  50 mg Oral Daily 02/07/16, MD   50 mg at 02/05/16 1306  . morphine 2 MG/ML injection 2 mg  2 mg Intravenous Q4H PRN 02/07/16, MD      . ondansetron [facility]) tablet 4 mg  4 mg Oral Q6H PRN [facility], MD       Or  . ondansetron [facility]) injection 4 mg  4 mg Intravenous Q6H PRN [facility], MD      . oxyCODONE (  Oxy IR/ROXICODONE) immediate release tablet 5-10 mg  5-10 mg Oral Q4H PRN Kennedy Bucker, MD   5 mg at 02/05/16 1311     Discharge Medications: Please see discharge summary for a list of discharge medications.  Relevant Imaging Results:  Relevant Lab Results:   Additional Information  SS # [id number]  Judi Cong, LCSW

## 2016-02-05 NOTE — Evaluation (Signed)
Physical Therapy Evaluation Patient Details Name: Yvonne Knight MRN: [id number] DOB: [birth year] Today's Date: 02/05/2016   History of Present Illness  82 yo female with onset of fall in her yard, resulting in fractures of R wrist ulna and radius, compacted R proximal humeral fracture and L pelvic fracture which all were non operative.  PMHx:  osteopenia R arm and osteoporosis L leg, arthritis, HLD, HTN, L TKA, old L wrist carpal fracture.  Clinical Impression  Pt is demonstrating limited mobility as expected from multiple non operative fractures.  Her plan is to continue to see acutely and look for a skilled admission for her to get back to her prior level of independent mobility.  Her family will be supportive after her discharge as all are present during her admission and interested in her care.      Follow Up Recommendations CIR    Equipment Recommendations  None recommended by PT    Recommendations for Other Services Rehab consult     Precautions / Restrictions Precautions Precautions: Fall (telemetry) Restrictions Weight Bearing Restrictions: Yes (RUE NWB and LLE WBAT) RUE Weight Bearing: Non weight bearing      Mobility  Bed Mobility Overal bed mobility: Needs Assistance Bed Mobility: Supine to Sit;Sit to Supine     Supine to sit: Mod assist Sit to supine: Mod assist   General bed mobility comments: assisted under L shoulder and with legs to sit bedside, returned to bed with help of pt who pivoted in sitting to help lift legs to bed  Transfers Overall transfer level: Needs assistance Equipment used: 1 person hand held assist Transfers: Sit to/from Stand Sit to Stand: Mod assist;Min assist;From elevated surface (used LUE)         General transfer comment: Pt is motivated and can assist with LUE lifting her along with PT cuing  Ambulation/Gait Ambulation/Gait assistance: Min assist (sidesteps bedside) Ambulation Distance (Feet): 3 Feet Assistive device: 1  person hand held assist Gait Pattern/deviations: Step-to pattern;Shuffle;Narrow base of support;Trunk flexed Gait velocity: reduced Gait velocity interpretation: Below normal speed for age/gender General Gait Details: sidesteps with help under her L arm and cues for safety, to keep touching bed with legs  Stairs            Wheelchair Mobility    Modified Rankin (Stroke Patients Only)       Balance Overall balance assessment: History of Falls                                           Pertinent Vitals/Pain Pain Assessment: Faces Faces Pain Scale: Hurts even more Pain Location: R arm and neck Pain Descriptors / Indicators: Aching;Sore Pain Intervention(s): Limited activity within patient's tolerance;Monitored during session;Premedicated before session;Repositioned;Ice applied    Home Living Family/patient expects to be discharged to:: Private residence Living Arrangements: Alone Available Help at Discharge: Family;Available PRN/intermittently Type of Home: House Home Access: Stairs to enter     Home Layout: One level Home Equipment: None      Prior Function Level of Independence: Independent               Hand Dominance        Extremity/Trunk Assessment   Upper Extremity Assessment: Generalized weakness;RUE deficits/detail RUE Deficits / Details: R arm in sling to avoid use of shoulder and wrist RUE: Unable to fully assess due to pain;Unable to fully assess  due to immobilization       Lower Extremity Assessment: Generalized weakness      Cervical / Trunk Assessment: Kyphotic  Communication   Communication: HOH (hears better out of her L ear)  Cognition Arousal/Alertness: Awake/alert Behavior During Therapy: WFL for tasks assessed/performed Overall Cognitive Status: Within Functional Limits for tasks assessed                      General Comments      Exercises     Assessment/Plan    PT Assessment Patient  needs continued PT services  PT Problem List Decreased strength;Decreased range of motion;Decreased activity tolerance;Decreased balance;Decreased mobility;Decreased coordination;Decreased knowledge of use of DME;Decreased safety awareness;Decreased skin integrity;Pain          PT Treatment Interventions DME instruction;Gait training;Functional mobility training;Therapeutic activities;Therapeutic exercise;Balance training;Neuromuscular re-education;Patient/family education    PT Goals (Current goals can be found in the Care Plan section)  Acute Rehab PT Goals Patient Stated Goal: to get up to side of bed and see how she is doing PT Goal Formulation: With patient/family Time For Goal Achievement: 02/19/16 Potential to Achieve Goals: Good    Frequency Min 2X/week   Barriers to discharge Inaccessible home environment;Decreased caregiver support home alone    Co-evaluation               End of Session Equipment Utilized During Treatment: Gait belt Activity Tolerance: Patient limited by fatigue;Patient limited by pain Patient left: in bed;with call bell/phone within reach;with bed alarm set;with family/visitor present Nurse Communication: Mobility status    Functional Assessment Tool Used: clinical judgment Functional Limitation: Mobility: Walking and moving around Mobility: Walking and Moving Around Current Status (D3267): At least 60 percent but less than 80 percent impaired, limited or restricted Mobility: Walking and Moving Around Goal Status [phone]): At least 1 percent but less than 20 percent impaired, limited or restricted    Time: 1410-1425 PT Time Calculation (min) (ACUTE ONLY): 15 min   Charges:   PT Evaluation $PT Eval Moderate Complexity: 1 Procedure     PT G Codes:   PT G-Codes **NOT FOR INPATIENT CLASS** Functional Assessment Tool Used: clinical judgment Functional Limitation: Mobility: Walking and moving around Mobility: Walking and Moving Around Current  Status (K9983): At least 60 percent but less than 80 percent impaired, limited or restricted Mobility: Walking and Moving Around Goal Status [phone]): At least 1 percent but less than 20 percent impaired, limited or restricted    Ivar Drape 02/05/2016, 3:50 PM    Samul Dada, PT MS Acute Rehab Dept. Number: [facility] [id number] and [facility] [phone]

## 2016-02-06 DIAGNOSIS — S52001A Unspecified fracture of upper end of right ulna, initial encounter for closed fracture: Secondary | ICD-10-CM | POA: Diagnosis not present

## 2016-02-06 NOTE — Progress Notes (Signed)
Physical Therapy Treatment Patient Details Name: Yvonne Knight MRN: [id number] DOB: [birth year] Today's Date: 02/06/2016    History of Present Illness 82 yo female with onset of fall in her yard, resulting in fractures of R wrist ulna and radius, compacted R proximal humeral fracture and L pelvic fracture which all were non operative.  PMHx:  osteopenia R arm and osteoporosis L leg, arthritis, HLD, HTN, L TKA, old L wrist carpal fracture.    PT Comments    Pt is tolerating a shorter session as PT has run into multiple obstacles trying to see her today.  Finally has been able to walk a short trip and do some bed exercise, practiced bed mob with good technique.  Expecting to have her to CIR soon with family hoping to get to SNF as a means to get her closer to them.  Will follow acutely to increase strength and to continue to progress gait on hemiwalker next visit.  Follow Up Recommendations  CIR     Equipment Recommendations  None recommended by PT    Recommendations for Other Services Rehab consult     Precautions / Restrictions Precautions Precautions: Fall Restrictions Weight Bearing Restrictions: Yes RUE Weight Bearing: Non weight bearing    Mobility  Bed Mobility Overal bed mobility: Needs Assistance Bed Mobility: Sit to Supine     Supine to sit: Mod assist;+2 for physical assistance;+2 for safety/equipment Sit to supine: Mod assist;+2 for physical assistance;+2 for safety/equipment   General bed mobility comments: legs with one and trunk with one, fatigued from [facility]  Transfers Overall transfer level: Needs assistance Equipment used: 1 person hand held assist Transfers: Sit to/from Stand Sit to Stand: Mod assist         General transfer comment: Pt is motivated and can assist with LUE lifting her along with PT cuing  Ambulation/Gait Ambulation/Gait assistance: Min assist;Mod assist Ambulation Distance (Feet): 4 Feet Assistive device: 1 person hand held  assist Gait Pattern/deviations: Step-to pattern;Decreased stride length;Wide base of support;Antalgic Gait velocity: reduced Gait velocity interpretation: Below normal speed for age/gender General Gait Details: sidesteps at side of bed, unsteady to walk away from bed, better with legs touching bedside   Stairs            Wheelchair Mobility    Modified Rankin (Stroke Patients Only)       Balance Overall balance assessment: History of Falls                                  Cognition Arousal/Alertness: Awake/alert Behavior During Therapy: WFL for tasks assessed/performed Overall Cognitive Status: Within Functional Limits for tasks assessed                      Exercises General Exercises - Lower Extremity Ankle Circles/Pumps: AROM;Both;5 reps Quad Sets: AROM;Both;10 reps Heel Slides: AROM;Both;10 reps Hip ABduction/ADduction: AROM;AAROM;Both;10 reps    General Comments        Pertinent Vitals/Pain Pain Assessment: Faces Pain Score: 6  Faces Pain Scale: Hurts even more Pain Location: R arm and L hip  Pain Descriptors / Indicators: Aching Pain Intervention(s): Monitored during session;Premedicated before session;Limited activity within patient's tolerance;Repositioned    Home Living                      Prior Function            PT Goals (current  goals can now be found in the care plan section) Acute Rehab PT Goals Patient Stated Goal: to try to take steps today Progress towards PT goals: Progressing toward goals    Frequency    Min 2X/week      PT Plan Current plan remains appropriate    Co-evaluation             End of Session   Activity Tolerance: Patient limited by fatigue Patient left: in bed;with call bell/phone within reach;with bed alarm set;with family/visitor present;with nursing/sitter in room     Time: 1735-1759 PT Time Calculation (min) (ACUTE ONLY): 24 min  Charges:  $Gait Training: 8-22  mins $Therapeutic Exercise: 8-22 mins                    G Codes:      Ivar Drape March 03, 2016, 6:13 PM    Samul Dada, PT MS Acute Rehab Dept. Number: [facility] [id number] and [facility] [phone]

## 2016-02-06 NOTE — Care Management Obs Status (Signed)
MEDICARE OBSERVATION STATUS NOTIFICATION   Patient Details  Name: Yvonne Knight MRN: [id number] Date of Birth: [birth year]   Medicare Observation Status Notification Given:  Yes (Son [name] verbalized understanding but declined to sign  / copy left with patient)    Caren Macadam, RN 02/06/2016, 10:14 AM

## 2016-02-06 NOTE — Progress Notes (Signed)
Subjective: Complains of right arm pain.  Objective: Vital signs in last 24 hours: Temp:  [98.2 F (36.8 C)-98.9 F (37.2 C)] 98.2 F (36.8 C) (09/24 0731) Pulse Rate:  [81-87] 81 (09/24 0731) Resp:  [18-19] 18 (09/24 0731) BP: (119-139)/(47-52) 122/52 (09/24 0917) SpO2:  [92 %-95 %] 92 % (09/24 0409) Weight change:  Last BM Date: 02/04/16  Intake/Output from previous day: 09/23 0701 - 09/24 0700 In: 120 [P.O.:120] Out: 250 [Urine:250] Intake/Output this shift: Total I/O In: -  Out: 200 [Urine:200]  General appearance: no distress Resp: clear to auscultation bilaterally Cardio: regular rate and rhythm, S1, S2 normal, no murmur, click, rub or gallop  Lab Results:  Recent Labs  02/04/16 2152 02/05/16 0335  WBC 15.5* 10.0  HGB 12.4 12.0  HCT 35.8 34.1*  PLT 148* 140*   BMET  Recent Labs  02/04/16 2152 02/05/16 0335  NA 140 140  K 3.8 3.9  CL 108 107  CO2 24 28  GLUCOSE 183* 178*  BUN 35* 31*  CREATININE 1.13* 0.98  CALCIUM 8.9 8.7*    Studies/Results: Dg Forearm Right  Addendum Date: 02/04/2016   ADDENDUM REPORT: 02/04/2016 21:23 ADDENDUM: The fracture at the proximal metaphysis-diaphysis junction is in the proximal ulna. No humeral fracture evident. Old trauma involving the proximal radius with questionable superimposed fracture in the proximal radial metaphysis. Electronically Signed   By: Bretta Bang III M.D.   On: 02/04/2016 21:23   Result Date: 02/04/2016 CLINICAL DATA:  Pain following fall EXAM: RIGHT FOREARM - 2 VIEW COMPARISON:  None. FINDINGS: Frontal and lateral views were obtained. There is an acute appearing fracture of the proximal humerus at the proximal metaphysis -diaphysis junction. There is evidence of old trauma with remodeling in the proximal radial metaphysis. There may be an acute fracture superimposed in this area. There is evidence of an apparent old fracture of the distal radial metaphysis. There is evidence of an old healed  fracture of the distal radial metaphysis. No other fractures are evident. No dislocation. There is a cyst in the lunate bone. There is soft tissue swelling in the proximal forearm. IMPRESSION: Acute fracture proximal radial metaphysis -diaphysis junction. Old trauma proximal radial metaphysis with questionable superimposed acute fracture. Old healed fracture distal radius. Soft tissue swelling proximal forearm. No dislocation. Electronically Signed: By: Bretta Bang III M.D. On: 02/04/2016 21:18   Dg Wrist Complete Right  Result Date: 02/04/2016 CLINICAL DATA:  Wrist pain after fall. EXAM: RIGHT WRIST - COMPLETE 3+ VIEW COMPARISON:  None. (RIGHT hand report dated November 03, 2013 reviewed though images were unable to be obtained) FINDINGS: No acute fracture deformity or dislocation. Old first metacarpal fracture deformity. Moderate first metacarpal carpal joint space narrowing, periventricular sclerosis consistent with osteoarthrosis. Osteopenia. No destructive bony lesions. Soft tissue planes are nonsuspicious. IMPRESSION: No acute fracture deformity or dislocation. Please note, osteopenia decreases sensitivity for acute nondisplaced fractures. Old first metacarpal fracture. Electronically Signed   By: Awilda Metro M.D.   On: 02/04/2016 22:46   Dg Humerus Right  Result Date: 02/04/2016 CLINICAL DATA:  Pain following fall EXAM: RIGHT HUMERUS - 2+ VIEW COMPARISON:  None. FINDINGS: Frontal and lateral views obtained. There is an acute fracture of the proximal ulnar metaphysis-diaphysis junction. Alignment appears near anatomic. There is evidence of old trauma with probable acute superimposed fracture in the proximal radial metaphysis. There is an impacted fracture of the proximal humeral metaphysis. No other fractures. No dislocations. There is moderate osteoarthritic change in the shoulder  and elbow joints. IMPRESSION: Impacted fracture of the proximal right humeral metaphysis. Fractures in the  proximal forearm. Moderate osteoarthritic change in the elbow and right shoulder joints. Bones appear somewhat osteoporotic. Electronically Signed   By: Bretta Bang III M.D.   On: 02/04/2016 21:20   Dg Hip Unilat With Pelvis 2-3 Views Left  Result Date: 02/04/2016 CLINICAL DATA:  Pain following fall EXAM: DG HIP (WITH OR WITHOUT PELVIS) 2-3V LEFT COMPARISON:  None. FINDINGS: Frontal pelvis as well as frontal and lateral left hip images were obtained. There is a comminuted fracture of the left superior pubic ramus with fracture fragments in near anatomic alignment. No other fractures are evident. No dislocation. There is mild symmetric narrowing of both hip joints. Bones are osteoporotic. There are foci of atherosclerotic calcification in the superior femoral arteries bilaterally. IMPRESSION: Comminuted fracture left superior pubic ramus with fracture fragments in near anatomic alignment. No other fractures are evident. No apparent dislocation. There is mild symmetric narrowing of both hip joints. Bones are diffusely osteoporotic. There is superficial femoral artery atherosclerosis bilaterally. Electronically Signed   By: Bretta Bang III M.D.   On: 02/04/2016 21:16    Medications: I have reviewed the patient's current medications.  Assessment/Plan:  LOS: 0 days    1.Humerus fracture -  Pain meds and splint for now.  2. Pubic ramus fracture -Ortho wants non weight bearing for now. See note. Will need rehab. CM aware. 3.Ulnar fracture - as above 4.HTN  - Controlled on home meds home meds   Time spent= 15 min. Marcelino Duster D 02/06/2016, 1:55 PM

## 2016-02-07 ENCOUNTER — Observation Stay: Payer: Medicare Other

## 2016-02-07 DIAGNOSIS — S52001A Unspecified fracture of upper end of right ulna, initial encounter for closed fracture: Secondary | ICD-10-CM | POA: Diagnosis not present

## 2016-02-07 LAB — URINALYSIS COMPLETE WITH MICROSCOPIC (ARMC ONLY)
BILIRUBIN URINE: NEGATIVE
Bacteria, UA: NONE SEEN
GLUCOSE, UA: 50 mg/dL — AB
KETONES UR: NEGATIVE mg/dL
Leukocytes, UA: NEGATIVE
NITRITE: NEGATIVE
Protein, ur: 30 mg/dL — AB
SPECIFIC GRAVITY, URINE: 1.02 (ref 1.005–1.030)
pH: 5 (ref 5.0–8.0)

## 2016-02-07 MED ORDER — SENNOSIDES-DOCUSATE SODIUM 8.6-50 MG PO TABS
1.0000 | ORAL_TABLET | Freq: Every day | ORAL | Status: DC
  Administered 2016-02-07: 1 via ORAL
  Filled 2016-02-07: qty 1

## 2016-02-07 MED ORDER — POTASSIUM CHLORIDE CRYS ER 10 MEQ PO TBCR
10.0000 meq | EXTENDED_RELEASE_TABLET | Freq: Every day | ORAL | Status: DC
  Administered 2016-02-07 – 2016-02-08 (×2): 10 meq via ORAL
  Filled 2016-02-07 (×2): qty 1

## 2016-02-07 NOTE — Progress Notes (Signed)
Physical Therapy Treatment Patient Details Name: Yvonne Knight MRN: [id number] DOB: November 23, 1925 Today's Date: 02/07/2016    History of Present Illness 82 yo female with onset of fall in her yard, resulting in fractures of R wrist ulna and radius, compacted R proximal humeral fracture and L pelvic fracture which all were non operative.  PMHx:  osteopenia R arm and osteoporosis L leg, arthritis, HLD, HTN, L TKA, old L wrist carpal fracture.    PT Comments    Pt able to ambulate 5 feet (x2 trials) with hemi-walker, min assist (plus 2nd assist for chair follow).  Pt appearing very motivated to participate but ambulation distance limited d/t fatigue, SOB, and L pelvic pain.  Pt's family with many questions regarding therapy POC and (with pt's permission) therapist answered questions (all appeared with good understanding).  Will continue to progress pt with strengthening, increasing ambulation distance, and increasing independence with functional mobility per pt tolerance.   Follow Up Recommendations  SNF     Equipment Recommendations  Other (comment) (Hemi-walker)    Recommendations for Other Services       Precautions / Restrictions Precautions Precautions: Fall Required Braces or Orthoses: Sling (R UE) Restrictions Weight Bearing Restrictions: Yes RUE Weight Bearing: Non weight bearing RLE Weight Bearing: Weight bearing as tolerated LLE Weight Bearing: Weight bearing as tolerated    Mobility  Bed Mobility Overal bed mobility: Needs Assistance Bed Mobility: Supine to Sit     Supine to sit: Min assist;Mod assist;+2 for physical assistance     General bed mobility comments: assist for trunk and L LE; vc's for technique required; HOB elevated  Transfers Overall transfer level: Needs assistance Equipment used: Hemi-walker Transfers: Sit to/from Stand Sit to Stand: Min assist         General transfer comment: vc's for hemi-walker use plus hand and feet placement; x1  rep from bed; x1 rep from chair  Ambulation/Gait Ambulation/Gait assistance: Min assist;+2 safety/equipment Ambulation Distance (Feet):  (5 feet; 5 feet) Assistive device: Hemi-walker   Gait velocity: decreased   General Gait Details: antalgic; decreased stance time L LE; vc's to increase UE support through [employer]    Modified Rankin (Stroke Patients Only)       Balance Overall balance assessment: Needs assistance Sitting-balance support: Feet unsupported;Single extremity supported Sitting balance-Leahy Scale: Fair Sitting balance - Comments: posterior lean initially but improved to close SBA   Standing balance support: Single extremity supported (on HW) Standing balance-Leahy Scale: Fair                      Cognition Arousal/Alertness: Awake/alert Behavior During Therapy: WFL for tasks assessed/performed Overall Cognitive Status: Within Functional Limits for tasks assessed                      Exercises General Exercises - Lower Extremity Ankle Circles/Pumps: AROM;Strengthening;10 reps;Supine Quad Sets: AROM;Strengthening;Both;10 reps;Supine Short Arc Quad: AROM;Strengthening;Both;10 reps;Supine Heel Slides: Strengthening;Both;10 reps;Supine (AROM R: AAROM L) Hip ABduction/ADduction: Strengthening;Both;10 reps;Supine (AROM R; AAROM L)  Vc's and tactile cues required for technique with ex's.    General Comments General comments (skin integrity, edema, etc.): R UE sling in place; pt resting in bed and reporting just recently using bedside commode.  Pt agreeable to PT session.  Pt's family present beginning and end of session.      Pertinent Vitals/Pain Pain Assessment: 0-10 Pain Score:  9  Pain Location: 9/10 L pelvic pain; 5/10 R UE pain Pain Descriptors / Indicators: Aching;Sore;Tender Pain Intervention(s): Limited activity within patient's tolerance;Monitored during session;RN gave pain meds during  session  Vitals stable and WFL throughout treatment session (HR and O2 on RA).    Home Living                      Prior Function            PT Goals (current goals can now be found in the care plan section) Acute Rehab PT Goals Patient Stated Goal: to try to walk further PT Goal Formulation: With patient/family Time For Goal Achievement: 02/21/16 Potential to Achieve Goals: Good Progress towards PT goals: Progressing toward goals    Frequency    7X/week      PT Plan Discharge plan needs to be updated;Frequency needs to be updated    Co-evaluation             End of Session Equipment Utilized During Treatment: Gait belt Activity Tolerance: Patient limited by fatigue;Patient limited by pain Patient left: in chair;with call bell/phone within reach;with chair alarm set;with family/visitor present (R UE supported in sling on pillow)     Time: 6789-3810 PT Time Calculation (min) (ACUTE ONLY): 39 min  Charges:  $Gait Training: 8-22 mins $Therapeutic Exercise: 8-22 mins                    G CodesHendricks Limes 16-Feb-2016, 2:02 PM Hendricks Limes, PT [phone]

## 2016-02-07 NOTE — Care Management (Signed)
Amion message sent to attending regarding SNF placement status. Requested to see when patient would be medically stable for discharge.

## 2016-02-07 NOTE — Care Management (Signed)
New testing ordered for shortness of breath and bleeding as noted by MD note. Will revisit disposition in the am.

## 2016-02-07 NOTE — Care Management (Signed)
Consult called to [facility] with [facility] for review.

## 2016-02-07 NOTE — NC FL2 (Signed)
[place] MEDICAID FL2 LEVEL OF CARE SCREENING TOOL     IDENTIFICATION  Patient Name: Yvonne Knight Birthdate: 02/09/1926 Sex: female Admission Date (Current Location): 02/04/2016  [place] and [place] Number:  Chiropodist and Address:  [facility], [address] [place], [place] [address]      Provider Number: [id number]  Attending Physician Name and Address:  Tonye Royalty, DO  Relative Name and Phone Number:       Current Level of Care: Hospital Recommended Level of Care: Skilled Nursing Facility Prior Approval Number:    Date Approved/Denied:   PASRR Number:   [id number]   Discharge Plan: SNF    Current Diagnoses: Patient Active Problem List   Diagnosis Date Noted  . Humerus fracture 02/05/2016  . Pubic ramus fracture (HCC) 02/05/2016  . Ulnar fracture 02/05/2016  . HTN (hypertension) 02/05/2016  . Fall with injury 02/05/2016    Orientation RESPIRATION BLADDER Height & Weight     Self, Time, Situation  Normal Continent Weight: 158 lb (71.7 kg) Height:  4\' 11"  (149.9 cm)  BEHAVIORAL SYMPTOMS/MOOD NEUROLOGICAL BOWEL NUTRITION STATUS      Continent  Regular Diet   AMBULATORY STATUS COMMUNICATION OF NEEDS Skin   Total Care Verbally Surgical wounds                       Personal Care Assistance Level of Assistance  Bathing, Dressing Bathing Assistance: Maximum assistance Feeding assistance: Independent Dressing Assistance: Maximum assistance     Functional Limitations Info             SPECIAL CARE FACTORS FREQUENCY  PT (By licensed PT)     PT Frequency: 5X day 5X week              Contractures Contractures Info: Present    Additional Factors Info                  Current Medications (02/07/2016):  This is the current hospital active medication list Current Facility-Administered Medications  Medication Dose Route Frequency Provider Last Rate Last Dose  . acetaminophen (TYLENOL)  tablet 650 mg  650 mg Oral Q6H PRN 02/09/2016, MD       Or  . acetaminophen (TYLENOL) suppository 650 mg  650 mg Rectal Q6H PRN Oralia Manis, MD      . amLODipine (NORVASC) tablet 10 mg  10 mg Oral Daily Oralia Manis, MD   10 mg at 02/07/16 0920  . aspirin EC tablet 325 mg  325 mg Oral Daily 02/09/16, MD   325 mg at 02/07/16 02/09/16  . enoxaparin (LOVENOX) injection 40 mg  40 mg Subcutaneous Q24H 4287, MD   40 mg at 02/06/16 2011  . furosemide (LASIX) tablet 20 mg  20 mg Oral Daily 2012, MD   20 mg at 02/07/16 0920  . hydroxychloroquine (PLAQUENIL) tablet 200 mg  200 mg Oral Daily 02/09/16, MD   200 mg at 02/07/16 0920  . losartan (COZAAR) tablet 50 mg  50 mg Oral Daily 02/09/16, MD   50 mg at 02/07/16 0920  . morphine 2 MG/ML injection 2 mg  2 mg Intravenous Q4H PRN 02/09/16, MD      . ondansetron [facility]) tablet 4 mg  4 mg Oral Q6H PRN [facility], MD       Or  . ondansetron [facility]) injection 4 mg  4 mg Intravenous Q6H PRN [facility], MD      .  oxyCODONE (Oxy IR/ROXICODONE) immediate release tablet 5-10 mg  5-10 mg Oral Q4H PRN Kennedy Bucker, MD   10 mg at 02/07/16 0515     Discharge Medications: Please see discharge summary for a list of discharge medications.  Relevant Imaging Results:  Relevant Lab Results:   Additional Information  SSN: [id number]  Katrinna Travieso, Darleen Crocker, LCSW

## 2016-02-07 NOTE — Care Management (Signed)
Case dicussed with Dr. Judithann Sheen.Patient does not meet inpatient criteria at this time. He will review chart further and make recommendations if appropriate.

## 2016-02-07 NOTE — Progress Notes (Addendum)
Clinical Education officer, museum (CSW) met with patient and her 2 sons Tommy and Clint at bedside this morning. CSW explained that patient is under Medicare observation, which means that Medicare will not pay for SNF. Son Konrad Dolores stated that Medicare should pay for 100 days of SNF. CSW explained that patient would require a 3 night qualifying inpatient stay at a hospital in order for Medicare to cover SNF. Son Clint reported that PT and MD are recommending SNF for patient. CSW provided emotional support and continued to educate family about observation status. Per chart RN case manager delivered observation notice. Observation notice was at the bedside. Per son Konrad Dolores RN case manager stated that it does not apply to patient because she has [place]. CSW explained the BCBS is patient's supplement and will not pay unless Medicare pays first. Sons appeared angry and asked to speak to CSW and RN case Programmer, multimedia. Sons also asked to speak to Dr. Rudene Christians. Per sons Dr. Rudene Christians stated that patient would be admitted under inpatient. CSW explained that patient can go home with home health and that Medicare will cover that or pay out of pocket for SNF. Per son Clint he will pay out pf pocket for SNF if that is what it takes however he stated that he is going to exhaust every avenue to get patient changed to inpatient. Son Berton Mount reported that he does not want SNF's in [facility] and prefers SNFs in [place]. Son requested referral to be sent to [place] [facility] and [place] notified CSW Mudlogger, [place] Surveyor, quantity and [facility] Lead RN Tourist information centre manager. Lead RN case manager called into the room and spoke with sons. CSW Surveyor, quantity also called into the room and spoke with sons. Per Dr. Rudene Christians he will come up to talk with family around lunch time.     CSW sent referrals to [place] and [place] made son Clint aware of private pay rates at [place]. Per son he is not going to pay that unless he has no choice. CSW will continue to follow and  assist as needed.   Per [place] admissions coordinator at [facility] they can accept patient under private pay. CSW made son Clint aware of above.   [employer], LCSW [phone]

## 2016-02-07 NOTE — Progress Notes (Signed)
Patient ID: Farris Blash, female   DOB: [birth year], 82 y.o.   MRN: [id number]   [facility] [employer] [facility] [place] [facility]   PATIENT NAME: Yvonne Knight    MR#:  [id number]  DATE OF BIRTH:  1925/08/28  SUBJECTIVE:  CHIEF COMPLAINT:   Chief Complaint  Patient presents with  . Fall  . Arm Injury  . Groin Pain  Patient seen and examined at bedside.  Reports that her pain is controlled on current regimen.  Family reports several complaints including observed worsening of shortness of breath and extreme dyspnea on minimal exertion.  Daughter also reports blood in underwear unsure if vaginal or rectal bleeding.  Patient denies any bleeding.   REVIEW OF SYSTEMS:  ROS  CONSTITUTIONAL: No fever/chills, fatigue, weakness, weight gain/loss, headache. EYES: No blurry or double vision. ENT: No tinnitus, postnasal drip, redness or soreness of the oropharynx. RESPIRATORY: No cough, dyspnea, wheeze, hemoptysis.  CARDIOVASCULAR: No chest pain, palpitations, syncope, orthopnea,  GASTROINTESTINAL: No nausea, vomiting, constipation, diarrhea, abdominal pain, hematemesis, melena or hematochezia. GENITOURINARY: No dysuria, frequency, hematuria. ENDOCRINE: No polyuria or nocturia. No heat or cold intolerance. HEMATOLOGY: No anemia, bruising, bleeding. INTEGUMENTARY: No rashes, ulcers, lesions. MUSCULOSKELETAL: No arthritis, gout, dyspnea.  Positive right arm and left leg pain.  NEUROLOGIC: No numbness, tingling, ataxia, seizure-type activity, weakness. PSYCHIATRIC: No anxiety, depression, insomnia.    DRUG ALLERGIES:  No Known Allergies VITALS:  Blood pressure (!) 140/55, pulse 80, temperature 98.5 F (36.9 C), temperature source Oral, resp. rate 18, height 4\' 11"  (1.499 m), weight 71.7 kg (158 lb), SpO2 97 %. PHYSICAL EXAMINATION:  Physical Exam  PHYSICAL EXAMINATION: VITAL SIGNS: Blood pressure (!) 140/55, pulse 80, temperature 98.5 F (36.9 C), temperature source Oral, resp.  rate 18, height 4\' 11"  (1.499 m), weight 71.7 kg (158 lb), SpO2 97 %.  GENERAL: 82 y.o.-year-old white female patient, well-developed, well-nourished lying in the bed in no acute distress.  Pleasant and cooperative.   HEENT: Head atraumatic, normocephalic. Pupils equal, round, reactive to light and accommodation. No scleral icterus. Extraocular muscles intact. Nares are patent. Oropharynx is clear. Mucus membranes moist. NECK: Supple, full range of motion. No JVD, no bruit heard. No thyroid enlargement, no tenderness, no cervical lymphadenopathy. CHEST: Normal breath sounds bilaterally. No wheezing, rales, rhonchi or crackles. No use of accessory muscles of respiration.  No reproducible chest wall tenderness.  CARDIOVASCULAR: S1, S2 normal. No murmurs, rubs, or gallops. Cap refill <2 seconds. ABDOMEN: Soft, nontender, nondistended. No rebound, guarding, rigidity. Normoactive bowel sounds present in all four quadrants. No organomegaly or mass. EXTREMITIES:. No pedal edema, cyanosis, or clubbing. R arm slinged with mild right hand edema.   NEUROLOGIC: Cranial nerves II through XII are grossly intact with no focal sensorimotor deficit. Sensation intact. Gait not checked. PSYCHIATRIC: The patient is alert and oriented x 3. Normal affect, mood, thought content. SKIN: Warm, dry, and intact without obvious rash, lesion, or ulcer.  LABORATORY PANEL:   CBC  Recent Labs Lab 02/05/16 0335  WBC 10.0  HGB 12.0  HCT 34.1*  PLT 140*   ------------------------------------------------------------------------------------------------------------------ Chemistries   Recent Labs Lab 02/05/16 0335  NA 140  K 3.9  CL 107  CO2 28  GLUCOSE 178*  BUN 31*  CREATININE 0.98  CALCIUM 8.7*   RADIOLOGY:  No results found. ASSESSMENT AND PLAN:  82 year old white female with history of HTN, HLD, OA, CHF, osteoporosis now admitted with"  1.Humerus fracture -  Pain meds and splint for now.  Ortho following.    2.Pubic ramus fracture -Ortho wants non weight bearing for now. See note. Will need rehab. CM aware. 3.Ulnar fracture - as above 4.HTN - Controlled on home meds home meds 5. Shortness of breath - patient has history of CHF and is on Lasix however family states symptoms are worsening.  Will check O2 with ambulation when able to.  6.  Anemia, chronic, stable.  Will monitor CBC.  Check FOBT and UA for evidence of bleeding given family report.    Patient and family would like to be placed at [facility] in [place] closer to her children's homes.   Patient lives alone in [place].    All the records are reviewed and case discussed with Care Management/Social Worker. Management plans discussed with the patient, family and they are in agreement.  CODE STATUS: Full  TOTAL TIME TAKING CARE OF THIS PATIENT: 30 minutes.   More than 50% of the time was spent in counseling/coordination of care: Kathaleen Bury M.D on 02/07/2016 at 2:24 PM  Between 7am to 6pm - Pager - [phone]  After 6pm go to [email or web address] - Social research officer, government  [facility] [place] [facility]  Office  [phone]  CC: Primary care physician; Barbette Reichmann, MD  Note: This dictation was prepared with Dragon dictation along with smaller phrase technology. Any transcriptional errors that result from this process are unintentional.

## 2016-02-07 NOTE — Progress Notes (Signed)
Inpatient Rehabilitation  I was asked to review pt's chart by Gweneth Dimitri, [facility] for possible IP Rehab.  I note pt. Is under observation status currently and would likely not qualify for an inpatient admission to CIR.  Additionally, pt. does not appear to have medical necessity to warrant IP rehab.  Per SW note, family is requesting SNF in [place].    I have updated Gweneth Dimitri of the above.    Weldon Picking PT Inpatient Rehab Admissions Coordinator Cell [phone] Office [phone]

## 2016-02-08 DIAGNOSIS — S52001A Unspecified fracture of upper end of right ulna, initial encounter for closed fracture: Secondary | ICD-10-CM | POA: Diagnosis not present

## 2016-02-08 MED ORDER — INFLUENZA VAC SPLIT QUAD 0.5 ML IM SUSY: 0.5000 mL | PREFILLED_SYRINGE | INTRAMUSCULAR | Status: DC

## 2016-02-08 MED ORDER — OXYCODONE HCL 5 MG PO TABS: 5.0000 mg | ORAL_TABLET | Freq: Four times a day (QID) | ORAL | 0 refills | Status: DC | PRN

## 2016-02-08 MED ORDER — SENNOSIDES-DOCUSATE SODIUM 8.6-50 MG PO TABS: 1.0000 | ORAL_TABLET | Freq: Every day | ORAL | 0 refills | Status: DC

## 2016-02-08 MED ORDER — ACETAMINOPHEN 325 MG PO TABS: 650.0000 mg | ORAL_TABLET | Freq: Four times a day (QID) | ORAL | 0 refills | Status: DC | PRN

## 2016-02-08 MED ORDER — INFLUENZA VAC SPLIT QUAD 0.5 ML IM SUSY: 0.5000 mL | PREFILLED_SYRINGE | INTRAMUSCULAR | 0 refills | Status: AC

## 2016-02-08 NOTE — Progress Notes (Signed)
Pt remaining alert and Oriented. No complaints of pain during the night. Voiding without difficulty. Remaining on room air. No complaints of shortness of breath during the night.

## 2016-02-08 NOTE — Clinical Social Work Placement (Signed)
   CLINICAL SOCIAL WORK PLACEMENT  NOTE  Date:  02/08/2016  Patient Details  Name: Marcelyn Ruppe MRN: [id number] Date of Birth: October 21, 1925  Clinical Social Work is seeking post-discharge placement for this patient at the Skilled  Nursing Facility level of care (*CSW will initial, date and re-position this form in  chart as items are completed):  Yes   Patient/family provided with [facility] Clinical Social Work Department's list of facilities offering this level of care within the geographic area requested by the patient (or if unable, by the patient's family).  Yes   Patient/family informed of their freedom to choose among providers that offer the needed level of care, that participate in Medicare, Medicaid or managed care program needed by the patient, have an available bed and are willing to accept the patient.  Yes   Patient/family informed of [facility]'s ownership interest in [facility], as well as of the fact that they are under no obligation to receive care at these facilities.  PASRR submitted to EDS on       PASRR number received on       Existing PASRR number confirmed on 02/07/16     FL2 transmitted to all facilities in geographic area requested by pt/family on 02/07/16     FL2 transmitted to all facilities within larger geographic area on       Patient informed that his/her managed care company has contracts with or will negotiate with certain facilities, including the following:        Yes   Patient/family informed of bed offers received.  Patient chooses bed at  [facility] )     Physician recommends and patient chooses bed at      Patient to be transferred to  [facility] ) on 02/08/16.  Patient to be transferred to facility by  (Patient's sons will transport patient. )     Patient family notified on 02/08/16 of transfer.  Name of family member notified:   (Patient's sons are at bedside and aware of D/C today. )      PHYSICIAN       Additional Comment:    _______________________________________________ Ichelle Harral, Darleen Crocker, LCSW 02/08/2016, 3:00 PM

## 2016-02-08 NOTE — Progress Notes (Signed)
Physical Therapy Treatment Patient Details Name: Yvonne Knight MRN: [id number] DOB: 10/31/1925 Today's Date: 02/08/2016    History of Present Illness 82 yo female with onset of fall in her yard, resulting in fractures of R wrist ulna and radius, compacted R proximal humeral fracture and L pelvic fracture which all were non operative.  PMHx:  osteopenia R arm and osteoporosis L leg, arthritis, HLD, HTN, L TKA, old L wrist carpal fracture.    PT Comments    Pt presented in bed agreeable for therapy, pt expressing anxiousness regarding possible d/c home today.  Adv would be beneficial for 24 assist at home.  If needed would request equipment (hemi-walker) details would be provided by LSW.  Pt indicated need for use of BSC.  Pt performed supine to sit with HOB elevated and use of bed rails with verbal cues for hand placement to facilitate transfer. Pt able to perform sit to stand with minA and performed stand pivot transfer to [facility].  After toileting pt able to ambulated with HW approx 25f with HW and step to pattern.  Pt required cues for placement of HW with gait.  Pt unable to ambulate further 2/2 fatigue and feeling nauseous. Pt seated in recliner with call button within reach and all current needs met.   Follow Up Recommendations  SNF     Equipment Recommendations  Other (comment) (Hemi-walker)    Recommendations for Other Services       Precautions / Restrictions Restrictions Weight Bearing Restrictions: Yes RUE Weight Bearing: Non weight bearing RLE Weight Bearing: Weight bearing as tolerated LLE Weight Bearing: Weight bearing as tolerated    Mobility  Bed Mobility Overal bed mobility: Needs Assistance Bed Mobility: Supine to Sit     Supine to sit: Mod assist     General bed mobility comments: verbal tactile cues for hand placement, additional time required   Transfers Overall transfer level: Needs assistance Equipment used: Hemi-walker Transfers: Sit to/from  Stand Sit to Stand: Min assist         General transfer comment: trunk leans into forward flexed position, use of handrail when transferring off BSC   Ambulation/Gait Ambulation/Gait assistance: Min assist Ambulation Distance (Feet): 8 Feet Assistive device: Hemi-walker Gait Pattern/deviations: Step-to pattern;Antalgic;Trunk flexed     General Gait Details: cues for HW placement, decreased wt beaering on L   Stairs            Wheelchair Mobility    Modified Rankin (Stroke Patients Only)       Balance Overall balance assessment: Needs assistance Sitting-balance support: Feet supported Sitting balance-Leahy Scale: Good     Standing balance support: Single extremity supported Standing balance-Leahy Scale: [place]                     Cognition                            Exercises      General Comments        Pertinent Vitals/Pain      Home Living                      Prior Function            PT Goals (current goals can now be found in the care plan section) Progress towards PT goals: Progressing toward goals    Frequency    7X/week  PT Plan Discharge plan needs to be updated    Co-evaluation             End of Session Equipment Utilized During Treatment: Gait belt Activity Tolerance: Patient limited by pain Patient left: in chair;with call bell/phone within reach;with chair alarm set;with family/visitor present     Time: 5929-2446 PT Time Calculation (min) (ACUTE ONLY): 35 min  Charges:  $Gait Training: 8-22 mins $Therapeutic Activity: 8-22 mins                    G Codes:      Raymund Manrique 02/23/2016, 10:28 AM

## 2016-02-08 NOTE — Care Management (Addendum)
Attending will be discharging patient today. Met with son, Smith Mcnicholas out side of room to discuss discharge plan. He is adamant that patient not be discharged until he speaks with administration. He is unhappy that patient is not impatient and remains observation. Chart has been reviewed by Dr. Doy Hutching and case management team for any inpatient admission criteria and patient does not meet that criteria for inpatient admission.  Son has ask to speak with administration. TC to Marylen Ponto who will come and speak with son. Son updated. Skip Estimable, CSW has offered to arrange private pay at [facility], I have offered to arrange home health services to the maximum. Son is refusing. He states security will have to remove his mother before she leaves. Explained to son that medicare would not pay for continued stay under observation since she was medically stable for discharge. Son verbalized understanding but remained adamant that he speak with administration.

## 2016-02-08 NOTE — Progress Notes (Signed)
Per attending MD patient is medically stable for D/C today. Per MD she will put in a discharge order today. Patient remains under observation. Clinical Child psychotherapist (CSW) and Scientist, physiological spoke with patient's son Clint and made him aware of above. Per son he is not accepting that patient is discharging today and insists that she can't get out of bed. CSW explained that patient can private pay at [facility]. Son insisted that the "highest person at [facility]" come up and speak to him. Per son security will have to escort his mother out of the hospital because he is refusing for her to leave.   Per [facility] a SNF in [place] they can accept patient today under private pay and require 2 weeks up front. CSW presented bed offer to son. Son told CSW to hold off because he was making "some contacts to get her status changed." RN case manager asked nursing administration to come speak with the family. CSW made CSW Chiropodist aware of above. CSW will continue to follow and assist as needed.   [employer], LCSW [phone]

## 2016-02-08 NOTE — Progress Notes (Signed)
Patient was discharged to [facility] SN[facility] via family. Sling in place. Oxy 10mg  PO given one hour before discharge. IV removed with cath intact. Discharge paperwork given to family. Allowed time for questions. Belongings sent with family. Report called to [facility].

## 2016-02-08 NOTE — Discharge Instructions (Addendum)
NON-WEIGHTBEARING RIGHT UPPER EXTREMITY X 6 WEEKS WEIGHTBEARING AS TOLERATED ON BILATERAL LOWER EXTREMITIES FOLLOW UP PHYSICAL THERAPY, PRIMARY CARE PROVIDER, ORTHOPEDIC SURGEON.

## 2016-02-08 NOTE — Discharge Summary (Signed)
[employer] - [place] at [facility]   PATIENT NAME: Yvonne Knight    MR#:  [id number]  DATE OF BIRTH:  [birth year]  DATE OF ADMISSION:  02/04/2016   ADMITTING PHYSICIAN: Oralia Manis, MD  DATE OF DISCHARGE: 02/08/16  PRIMARY CARE PHYSICIAN: Barbette Reichmann, MD   ADMISSION DIAGNOSIS:  Fracture of right proximal ulna, closed, initial encounter [S52.001A] Proximal humerus fracture, right, closed, initial encounter [S42.201A] Closed nondisplaced fracture of pelvis, unspecified part of pelvis, initial encounter (HCC) [S32.9XXA] DISCHARGE DIAGNOSIS:  Principal Problem:   Fall with injury Active Problems:   Humerus fracture   Pubic ramus fracture (HCC)   Ulnar fracture   HTN (hypertension)  SECONDARY DIAGNOSIS:   Past Medical History:  Diagnosis Date  . Arthritis   . CHF (congestive heart failure) (HCC)   . HLD (hyperlipidemia)   . Hypertension   . Osteoporosis    HOSPITAL COURSE:  This is an 82 year old white female who was previously living independently, with a history of arthritis, CHF, hyperlipidemia, osteoporosis and hypertension. She was admitted on 02/04/2016 following a mechanical fall during which she sustained fractures to her right ulna, right humerus and pelvis. She was admitted for orthopedic evaluation, pain control and physical therapy. Ortho Evra by his neurosurgical intervention necessary at this time. Physical therapy recommendations nonweightbearing to the right upper extremity for 6 weeks and weight-bearing as tolerated bilateral lower extremities.   Patient's hospital course was complicated by daughter's report of recent worsening shortness of breath, recent blood found in the patient's underwear although patient denied. Medical workup was pursued including urinalysis, FOBT, chest x-ray.   Urinalysis showed 6-30 red blood cells but was negative for any urinary tract infection. Blood work has been essentially within normal limits. Chest x-ray  showed no acute cardio coronary process. Pulmonary venous hypertension was noted which is chronic. FOBT was not obtained however patient's hemoglobin was stable and there was no evidence of any acute bleeding during the hospitalization.  DISCHARGE CONDITIONS:  Stable.  CONSULTS OBTAINED:  Treatment Team:  Gracelyn Nurse, MD Kennedy Bucker, MD DRUG ALLERGIES:  No Known Allergies DISCHARGE MEDICATIONS:  START taking these medications    Morning Noon Evening Bedtime As Needed  acetaminophen 325 MG tablet  Commonly known as: TYLENOL  Take 2 tablets (650 mg total) by mouth every 6 (six) hours as needed for mild pain (or Fever >/= 101).               Influenza vac split quadrivalent PF 0.5 ML injection  Commonly known as: FLUARIX  Inject 0.5 mLs into the muscle tomorrow at 10 am.  Start taking on: 02/09/2016               oxyCODONE 5 MG immediate release tablet  Commonly known as: Oxy IR/ROXICODONE  Take 1 tablet (5 mg total) by mouth every 6 (six) hours as needed for moderate pain, severe pain or breakthrough pain.               senna-docusate 8.6-50 MG tablet  Commonly known as: Senokot-S  Take 1 tablet by mouth at bedtime.                                CONTINUE taking these medications   CONTINUE taking these medications   Morning Noon Evening Bedtime As Needed  amLODipine 10 MG tablet  Commonly known as: NORVASC  Take 10 mg by mouth daily.  aspirin 325 MG EC tablet  Take 325 mg by mouth daily.               CALCIUM 500 +D 500-400 MG-UNIT Tabs  Generic drug: Calcium Carb-Cholecalciferol  Take 1 tablet by mouth 2 (two) times daily.               furosemide 20 MG tablet  Commonly known as: LASIX  Take 20 mg by mouth daily.               hydroxychloroquine 200 MG tablet  Commonly known as: PLAQUENIL  Take 200 mg by mouth daily.               losartan 50 MG tablet  Commonly known as: COZAAR    Take 50 mg by mouth daily.               meclizine 25 MG tablet  Commonly known as: ANTIVERT  Take 25 mg by mouth 2 (two) times daily.               potassium chloride 10 MEQ tablet  Commonly known as: K-DUR  Take 10 mEq by mouth daily.                               DISCHARGE INSTRUCTIONS:  Patient is instructed to follow up with her primary care provider, physical therapy and orthopedics. DIET:  {Heart healthy DISCHARGE CONDITION:  Stable ACTIVITY:  Nonweightbearing to the right upper extremity for 6 weeks. Weightbearing as tolerated bilateral lower extremities. OXYGEN:  Patient may utilize 2 L O2 via nasal cannula if needed during exertion. DISCHARGE LOCATION:  Skilled nursing facility  If you experience worsening of your admission symptoms, develop shortness of breath, life threatening emergency, suicidal or homicidal thoughts you must seek medical attention immediately by calling 911 or calling your MD immediately  if symptoms less severe.  You Must read complete instructions/literature along with all the possible adverse reactions/side effects for all the Medicines you take and that have been prescribed to you. Take any new Medicines after you have completely understood and accpet all the possible adverse reactions/side effects.   Please note  You were cared for by a hospitalist during your hospital stay. If you have any questions about your discharge medications or the care you received while you were in the hospital after you are discharged, you can call the unit and asked to speak with the hospitalist on call if the hospitalist that took care of you is not available. Once you are discharged, your primary care physician will handle any further medical issues. Please note that NO REFILLS for any discharge medications will be authorized once you are discharged, as it is imperative that you return to your primary care physician (or establish a  relationship with a primary care physician if you do not have one) for your aftercare needs so that they can reassess your need for medications and monitor your lab values.    On the day of Discharge:  VITAL SIGNS:  Blood pressure (!) 123/54, pulse 83, temperature 98.7 F (37.1 C), temperature source Oral, resp. rate 19, height 4\' 11"  (1.499 m), weight 71.7 kg (158 lb), SpO2 95 %. PHYSICAL EXAMINATION:  GENERAL: 82 y.o.-year-old white female patient, well-developed, well-nourished lying in the bed in no acute distress.  Pleasant and cooperative.   HEENT: Head atraumatic, normocephalic. Pupils equal, round, reactive to light and accommodation.  No scleral icterus. Extraocular muscles intact. Nares are patent. Oropharynx is clear. Mucus membranes moist. NECK: Supple, full range of motion. No JVD, no bruit heard. No thyroid enlargement, no tenderness, no cervical lymphadenopathy. CHEST: Normal breath sounds bilaterally. No wheezing, rales, rhonchi or crackles. No use of accessory muscles of respiration.  No reproducible chest wall tenderness.  CARDIOVASCULAR: S1, S2 normal. No murmurs, rubs, or gallops. Cap refill <2 seconds. ABDOMEN: Soft, nontender, nondistended. No rebound, guarding, rigidity. Normoactive bowel sounds present in all four quadrants. No organomegaly or mass. EXTREMITIES:. No pedal edema, cyanosis, or clubbing. R arm slinged with mild right hand edema.   NEUROLOGIC: Cranial nerves II through XII are grossly intact with no focal sensorimotor deficit. Sensation intact. Gait not checked. PSYCHIATRIC: The patient is alert and oriented x 3. Normal affect, mood, thought content. SKIN: Warm, dry, and intact without obvious rash, lesion, or ulcer. DATA REVIEW:   CBC  Recent Labs Lab 02/05/16 0335  WBC 10.0  HGB 12.0  HCT 34.1*  PLT 140*    Chemistries   Recent Labs Lab 02/05/16 0335  NA 140  K 3.9  CL 107  CO2 28  GLUCOSE 178*  BUN 31*  CREATININE 0.98  CALCIUM 8.7*       Microbiology Results  No results found for this or any previous visit.  RADIOLOGY:  Dg Chest 1 View  Result Date: 02/07/2016 CLINICAL DATA:  82 y/o F; shortness of breath with history of congestive heart failure and hypertension. EXAM: CHEST 1 VIEW COMPARISON:  06/11/2006 chest radiograph. FINDINGS: Deformity of the right proximal humerus probably represents sequelae of old fracture. Aortic atherosclerosis with calcification of the arch. Stable cardiomediastinal silhouette given differences in projection and technique. No focal consolidation, pneumothorax, or pleural effusion. Pulmonary venous hypertension. IMPRESSION: No acute cardiopulmonary process is identified. Pulmonary venous hypertension. Electronically Signed   By: Mitzi Hansen M.D.   On: 02/07/2016 15:27     Management plans discussed with the patient, family and they are in agreement.  CODE STATUS:     Code Status Orders        Start     Ordered   02/05/16 0102  Full code  Continuous     02/05/16 0101    Code Status History    Date Active Date Inactive Code Status Order ID Comments User Context   This patient has a current code status but no historical code status.    Advance Directive Documentation   Flowsheet Row Most Recent Value  Type of Advance Directive  Healthcare Power of Attorney, Living will  Pre-existing out of facility DNR order (yellow form or pink MOST form)  No data  "MOST" Form in Place?  No data      TOTAL TIME TAKING CARE OF THIS PATIENT: 35 minutes.    Tonye Royalty M.D on 02/08/2016 at 2:38 PM  Between 7am to 6pm - Pager - [phone]  After 6pm go to [email or web address] - Social research officer, government  [facility] [place] [facility]  Office  [phone]  CC: Primary care physician; Barbette Reichmann, MD   Note: This dictation was prepared with Dragon dictation along with smaller phrase technology. Any transcriptional errors that result from this process are  unintentional.

## 2016-02-08 NOTE — Progress Notes (Signed)
Per administration patient will remain observation as there is no criteria met to change her to inpatient. Patient's sons are agreeable to pay privately for patient to go to [facility] in [place] today. Per [facility] admissions coordinator patient will go to room 211. RN will call report to [employer] at [phone]. Patient's sons will provide transport. Clinical Education officer, museum (CSW) prepared D/C packet and sent D/C orders to [facility]. Patient and her family are aware of above. Please reconsult if future social work needs arise. CSW signing off.   [employer], LCSW [phone]

## 2017-11-09 ENCOUNTER — Other Ambulatory Visit: Payer: Self-pay | Admitting: Internal Medicine

## 2017-11-09 ENCOUNTER — Ambulatory Visit
Admission: RE | Admit: 2017-11-09 | Discharge: 2017-11-09 | Disposition: A | Discharge status: Home / Self Care | Payer: Medicare Other | Source: Ambulatory Visit | Attending: Internal Medicine | Admitting: Internal Medicine

## 2017-11-09 DIAGNOSIS — F0150 Vascular dementia without behavioral disturbance: Secondary | ICD-10-CM | POA: Insufficient documentation

## 2017-11-09 DIAGNOSIS — Z9181 History of falling: Secondary | ICD-10-CM

## 2017-11-09 DIAGNOSIS — R443 Hallucinations, unspecified: Secondary | ICD-10-CM

## 2017-11-09 MED ORDER — GADOBENATE DIMEGLUMINE 529 MG/ML IV SOLN: 10.0000 mL | Freq: Once | INTRAVENOUS | Status: DC | PRN

## 2017-11-16 ENCOUNTER — Other Ambulatory Visit: Payer: Self-pay | Admitting: Orthopedic Surgery

## 2017-11-16 DIAGNOSIS — S32010A Wedge compression fracture of first lumbar vertebra, initial encounter for closed fracture: Secondary | ICD-10-CM

## 2017-11-22 ENCOUNTER — Ambulatory Visit
Admission: RE | Admit: 2017-11-22 | Discharge: 2017-11-22 | Disposition: A | Discharge status: Home / Self Care | Payer: Medicare Other | Source: Ambulatory Visit | Attending: Orthopedic Surgery | Admitting: Orthopedic Surgery

## 2017-11-22 DIAGNOSIS — M4316 Spondylolisthesis, lumbar region: Secondary | ICD-10-CM | POA: Diagnosis not present

## 2017-11-22 DIAGNOSIS — M47816 Spondylosis without myelopathy or radiculopathy, lumbar region: Secondary | ICD-10-CM | POA: Insufficient documentation

## 2017-11-22 DIAGNOSIS — X58XXXA Exposure to other specified factors, initial encounter: Secondary | ICD-10-CM | POA: Insufficient documentation

## 2017-11-22 DIAGNOSIS — S32010A Wedge compression fracture of first lumbar vertebra, initial encounter for closed fracture: Secondary | ICD-10-CM | POA: Insufficient documentation

## 2017-11-26 MED ORDER — CEFAZOLIN SODIUM-DEXTROSE 2-4 GM/100ML-% IV SOLN
2.0000 g | Freq: Once | INTRAVENOUS | Status: AC
  Administered 2017-11-27: 2 g via INTRAVENOUS

## 2017-11-27 ENCOUNTER — Ambulatory Visit: Payer: Medicare Other

## 2017-11-27 ENCOUNTER — Ambulatory Visit
Admission: RE | Admit: 2017-11-27 | Discharge: 2017-11-27 | Disposition: A | Discharge status: Home / Self Care | Payer: Medicare Other | Source: Ambulatory Visit | Attending: Orthopedic Surgery | Admitting: Orthopedic Surgery

## 2017-11-27 ENCOUNTER — Encounter
Admission: RE | Disposition: A | Discharge status: Home / Self Care | Payer: Self-pay | Source: Ambulatory Visit | Attending: Orthopedic Surgery

## 2017-11-27 DIAGNOSIS — S32010A Wedge compression fracture of first lumbar vertebra, initial encounter for closed fracture: Secondary | ICD-10-CM | POA: Diagnosis present

## 2017-11-27 DIAGNOSIS — E669 Obesity, unspecified: Secondary | ICD-10-CM | POA: Insufficient documentation

## 2017-11-27 DIAGNOSIS — Y92009 Unspecified place in unspecified non-institutional (private) residence as the place of occurrence of the external cause: Secondary | ICD-10-CM | POA: Diagnosis not present

## 2017-11-27 DIAGNOSIS — W19XXXA Unspecified fall, initial encounter: Secondary | ICD-10-CM | POA: Insufficient documentation

## 2017-11-27 DIAGNOSIS — I110 Hypertensive heart disease with heart failure: Secondary | ICD-10-CM | POA: Diagnosis not present

## 2017-11-27 DIAGNOSIS — Z79899 Other long term (current) drug therapy: Secondary | ICD-10-CM | POA: Insufficient documentation

## 2017-11-27 DIAGNOSIS — Z7982 Long term (current) use of aspirin: Secondary | ICD-10-CM | POA: Diagnosis not present

## 2017-11-27 DIAGNOSIS — M810 Age-related osteoporosis without current pathological fracture: Secondary | ICD-10-CM | POA: Diagnosis not present

## 2017-11-27 DIAGNOSIS — Z419 Encounter for procedure for purposes other than remedying health state, unspecified: Secondary | ICD-10-CM

## 2017-11-27 DIAGNOSIS — Z7983 Long term (current) use of bisphosphonates: Secondary | ICD-10-CM | POA: Insufficient documentation

## 2017-11-27 DIAGNOSIS — F0390 Unspecified dementia without behavioral disturbance: Secondary | ICD-10-CM | POA: Diagnosis not present

## 2017-11-27 DIAGNOSIS — Z96632 Presence of left artificial wrist joint: Secondary | ICD-10-CM | POA: Insufficient documentation

## 2017-11-27 DIAGNOSIS — I509 Heart failure, unspecified: Secondary | ICD-10-CM | POA: Diagnosis not present

## 2017-11-27 DIAGNOSIS — Z96652 Presence of left artificial knee joint: Secondary | ICD-10-CM | POA: Insufficient documentation

## 2017-11-27 HISTORY — PX: KYPHOPLASTY: SHX5884

## 2017-11-27 SURGERY — KYPHOPLASTY
Anesthesia: Monitor Anesthesia Care | Site: Back

## 2017-11-27 MED ORDER — PROPOFOL 10 MG/ML IV BOLUS
INTRAVENOUS | Status: AC
  Filled 2017-11-27: qty 20

## 2017-11-27 MED ORDER — KETAMINE HCL 50 MG/ML IJ SOLN
INTRAMUSCULAR | Status: DC | PRN
  Administered 2017-11-27: 20 mg via INTRAMUSCULAR
  Administered 2017-11-27: 10 mg via INTRAMUSCULAR

## 2017-11-27 MED ORDER — PROPOFOL 10 MG/ML IV BOLUS
INTRAVENOUS | Status: DC | PRN
  Administered 2017-11-27: 40 mg via INTRAVENOUS

## 2017-11-27 MED ORDER — MIDAZOLAM HCL 2 MG/2ML IJ SOLN
INTRAMUSCULAR | Status: AC
  Filled 2017-11-27: qty 2

## 2017-11-27 MED ORDER — LIDOCAINE HCL 1 % IJ SOLN
INTRAMUSCULAR | Status: DC | PRN
  Administered 2017-11-27: 20 mL

## 2017-11-27 MED ORDER — FENTANYL CITRATE (PF) 100 MCG/2ML IJ SOLN
25.0000 ug | INTRAMUSCULAR | Status: DC | PRN
  Administered 2017-11-27: 25 ug via INTRAVENOUS

## 2017-11-27 MED ORDER — LIDOCAINE HCL (PF) 1 % IJ SOLN
INTRAMUSCULAR | Status: AC
Start: 2017-11-27 — End: ?
  Filled 2017-11-27: qty 30

## 2017-11-27 MED ORDER — FENTANYL CITRATE (PF) 100 MCG/2ML IJ SOLN
INTRAMUSCULAR | Status: AC
  Administered 2017-11-27: 25 ug via INTRAVENOUS
  Filled 2017-11-27: qty 2

## 2017-11-27 MED ORDER — MIDAZOLAM HCL 2 MG/2ML IJ SOLN
INTRAMUSCULAR | Status: DC | PRN
  Administered 2017-11-27: 1 mg via INTRAVENOUS

## 2017-11-27 MED ORDER — IOPAMIDOL (ISOVUE-M 200) INJECTION 41%
INTRAMUSCULAR | Status: DC | PRN
  Administered 2017-11-27: 20 mL

## 2017-11-27 MED ORDER — BUPIVACAINE-EPINEPHRINE (PF) 0.5% -1:200000 IJ SOLN
INTRAMUSCULAR | Status: AC
  Filled 2017-11-27: qty 30

## 2017-11-27 MED ORDER — CEFAZOLIN SODIUM-DEXTROSE 2-4 GM/100ML-% IV SOLN
INTRAVENOUS | Status: AC
Start: 2017-11-27 — End: 2017-11-27
  Filled 2017-11-27: qty 100

## 2017-11-27 MED ORDER — ONDANSETRON HCL 4 MG/2ML IJ SOLN: 4.0000 mg | Freq: Once | INTRAMUSCULAR | Status: DC | PRN

## 2017-11-27 MED ORDER — BUPIVACAINE-EPINEPHRINE (PF) 0.5% -1:200000 IJ SOLN
INTRAMUSCULAR | Status: DC | PRN
  Administered 2017-11-27: 20 mL

## 2017-11-27 MED ORDER — PROPOFOL 500 MG/50ML IV EMUL
INTRAVENOUS | Status: DC | PRN
  Administered 2017-11-27: 125 ug/kg/min via INTRAVENOUS

## 2017-11-27 MED ORDER — LACTATED RINGERS IV SOLN
INTRAVENOUS | Status: DC
  Administered 2017-11-27: 12:00:00 via INTRAVENOUS

## 2017-11-27 SURGICAL SUPPLY — 16 items
CEMENT KYPHON CX01A KIT/MIXER (Cement) ×2 IMPLANT
DERMABOND ADVANCED (GAUZE/BANDAGES/DRESSINGS) ×1
DERMABOND ADVANCED .7 DNX12 (GAUZE/BANDAGES/DRESSINGS) ×1 IMPLANT
DEVICE BIOPSY BONE KYPHX (INSTRUMENTS) ×2 IMPLANT
DRAPE C-ARM XRAY 36X54 (DRAPES) ×2 IMPLANT
DURAPREP 26ML APPLICATOR (WOUND CARE) ×2 IMPLANT
GLOVE SURG SYN 9.0  PF PI (GLOVE) ×1
GLOVE SURG SYN 9.0 PF PI (GLOVE) ×1 IMPLANT
GOWN SRG 2XL LVL 4 RGLN SLV (GOWNS) ×1 IMPLANT
GOWN STRL NON-REIN 2XL LVL4 (GOWNS) ×1
GOWN STRL REUS W/ TWL LRG LVL3 (GOWN DISPOSABLE) ×1 IMPLANT
GOWN STRL REUS W/TWL LRG LVL3 (GOWN DISPOSABLE) ×1
PACK KYPHOPLASTY (MISCELLANEOUS) ×2 IMPLANT
STRAP SAFETY 5IN WIDE (MISCELLANEOUS) ×2 IMPLANT
TRAY KYPHOPAK 15/3 EXPRESS 1ST (MISCELLANEOUS) ×2 IMPLANT
TRAY KYPHOPAK 20/3 EXPRESS 1ST (MISCELLANEOUS) ×1 IMPLANT

## 2017-11-27 NOTE — Op Note (Deleted)
11/27/2017  4:32 PM  PATIENT:  Yvonne Knight  84 y.o. female  PRE-OPERATIVE DIAGNOSIS:  closed compression fracture T11  POST-OPERATIVE DIAGNOSIS:  closed compression fracture T11  PROCEDURE:  Procedure(s): KYPHOPLASTY-L1 (N/A)  SURGEON: Laurene Footman, MD  ASSISTANTS: None  ANESTHESIA:   local and MAC  EBL:  Total I/O In: 400 [I.V.:400] Out: 5 [Blood:5]  BLOOD ADMINISTERED:none  DRAINS: none   LOCAL MEDICATIONS USED:  MARCAINE    and XYLOCAINE   SPECIMEN:  No Specimen  DISPOSITION OF SPECIMEN:  N/A  COUNTS:  YES  TOURNIQUET:  * No tourniquets in log *  IMPLANTS: None cement  DICTATION: .Dragon Dictation  patient brought the operating room and after adequate sedation was given the patient was placed prone.  C arm brought in in good visualization was obtained in both AP and lateral projections.  After patient identification and timeout procedures were completed, 10 cc of 1% Xylocaine was infiltrated on the right side in the area of the planned incision.  The back was then prepped and draped in the usual sterile fashion and repeat timeout procedure carried out.  Spinal needle was then used to get down to the pedicle on the right side at T 11 and a 50-50 mix of 1% Xylocaine, half percent Sensorcaine with epinephrine was injected along the tract down to the bone.  After allowing this to set a small incision was made and a trocar advanced in an extrapedicular fashion into the vertebral body where a biopsy was attempted but not obtained.  Drilling was carried out and then inflation of the balloon to about 3 cc.  The cement was mixed and was the appropriate consistency approximately 3-1/2 cc of bone cement was infiltrated into the body without extravasation getting good fill from superior to inferior endplates and right and left side.  After the cemented set trochars removed and permanent serum views were obtained.  Skin closed with Dermabond followed by Band-Aid    PLAN OF  CARE: Discharge to home after PACU  PATIENT DISPOSITION:  PACU - hemodynamically stable.

## 2017-11-27 NOTE — Addendum Note (Signed)
Addendum  created 11/27/17 1511 by Naomie Dean, MD   Order list changed, Order sets accessed

## 2017-11-27 NOTE — Transfer of Care (Signed)
Immediate Anesthesia Transfer of Care Note  Patient: Yvonne Knight  Procedure(s) Performed: Anne Ng (N/A Back)  Patient Location: PACU  Anesthesia Type:MAC  Level of Consciousness: drowsy and patient cooperative  Airway & Oxygen Therapy: Patient Spontanous Breathing and Patient connected to nasal cannula oxygen  Post-op Assessment: Report given to RN and Post -op Vital signs reviewed and stable  Post vital signs: Reviewed and stable  Last Vitals:  Vitals Value Taken Time  BP 124/56 11/27/2017  2:18 PM  Temp 36.1 C 11/27/2017  2:18 PM  Pulse 87 11/27/2017  2:19 PM  Resp 14 11/27/2017  2:19 PM  SpO2 100 % 11/27/2017  2:19 PM  Vitals shown include unvalidated device data.  Last Pain:  Vitals:   11/27/17 1204  TempSrc: Oral  PainSc: 10-Worst pain ever         Complications: No apparent anesthesia complications

## 2017-11-27 NOTE — Op Note (Signed)
11/27/2017  2:13 PM  PATIENT:  Yvonne Knight  84 y.o. female  PRE-OPERATIVE DIAGNOSIS:  closed compression fracture   L1  POST-OPERATIVE DIAGNOSIS:  closed compression fracture L1  PROCEDURE:  Procedure(s): KYPHOPLASTY-L1 (N/A)  SURGEON: Laurene Footman, MD  ASSISTANTS: None  ANESTHESIA:   local and MAC  EBL:  Total I/O In: 400 [I.V.:400] Out: 5 [Blood:5]  BLOOD ADMINISTERED:none  DRAINS: none   LOCAL MEDICATIONS USED:  MARCAINE    and XYLOCAINE   SPECIMEN:  Source of Specimen:  L1 vertebral body  DISPOSITION OF SPECIMEN:  PATHOLOGY  COUNTS:  YES  TOURNIQUET:  * No tourniquets in log *  IMPLANTS: Bone cement  DICTATION: .Dragon Dictation patient brought the operating room and after adequate sedation was given the patient was placed prone.  C arm brought in in good visualization was obtained in both AP and lateral projections.  After patient identification and timeout procedures were completed, 10 cc of 1% Xylocaine was infiltrated on the right side in the area of the planned incision.  The back was then prepped and draped in the usual sterile fashion and repeat timeout procedure carried out.  Spinal needle was then used to get down to the pedicle on the right side at L1 and a 50-50 mix of 1% Xylocaine, half percent Sensorcaine with epinephrine was injected along the tract down to the bone.  After allowing this to set a small incision was made and a trocar advanced in an extrapedicular fashion into the vertebral body where a biopsy was obtained.  Drilling was carried out and then inflation of the balloon to about 4 cc.  The cement was mixed and was the appropriate consistency approximately 5-1/2 cc of bone cement was infiltrated into the body without extravasation getting good fill from superior to inferior endplates and right and left side.  After the cemented set trochars removed and permanent serum views were obtained.  Skin closed with Dermabond followed by  Band-Aid  PLAN OF CARE: Admit for overnight observation  PATIENT DISPOSITION:  PACU - hemodynamically stable.

## 2017-11-27 NOTE — Discharge Instructions (Addendum)
Remove Band-Aid on Thursday and then okay to shower.  Take it easy today and tomorrow then resume normal activities.  AMBULATORY SURGERY  DISCHARGE INSTRUCTIONS   1) The drugs that you were given will stay in your system until tomorrow so for the next 24 hours you should not:  A) Drive an automobile B) Make any legal decisions C) Drink any alcoholic beverage   2) You may resume regular meals tomorrow.  Today it is better to start with liquids and gradually work up to solid foods.  You may eat anything you prefer, but it is better to start with liquids, then soup and crackers, and gradually work up to solid foods.   3) Please notify your doctor immediately if you have any unusual bleeding, trouble breathing, redness and pain at the surgery site, drainage, fever, or pain not relieved by medication.    4) Additional Instructions:   Please contact your physician with any problems or Same Day Surgery at [phone], Monday through Friday 6 am to 4 pm, or [place] [facility] number at [phone].

## 2017-11-27 NOTE — Anesthesia Preprocedure Evaluation (Addendum)
Anesthesia Evaluation  Patient identified by MRN, date of birth, ID band Patient awake    Reviewed: Allergy & Precautions, NPO status , Patient's Chart, lab work & pertinent test results  History of Anesthesia Complications Negative for: history of anesthetic complications  Airway Mallampati: III       Dental   Pulmonary neg sleep apnea, neg COPD,           Cardiovascular hypertension, Pt. on medications +CHF  (-) Past MI (-) dysrhythmias (-) Valvular Problems/Murmurs     Neuro/Psych neg Seizures    GI/Hepatic Neg liver ROS, neg GERD  ,  Endo/Other  neg diabetes  Renal/GU negative Renal ROS     Musculoskeletal   Abdominal   Peds  Hematology   Anesthesia Other Findings   Reproductive/Obstetrics                            Anesthesia Physical Anesthesia Plan  ASA: III  Anesthesia Plan:    Post-op Pain Management:    Induction:   PONV Risk Score and Plan:   Airway Management Planned: Nasal Cannula  Additional Equipment:   Intra-op Plan:   Post-operative Plan:   Informed Consent: I have reviewed the patients History and Physical, chart, labs and discussed the procedure including the risks, benefits and alternatives for the proposed anesthesia with the patient or authorized representative who has indicated his/her understanding and acceptance.     Plan Discussed with:   Anesthesia Plan Comments:         Anesthesia Quick Evaluation

## 2017-11-27 NOTE — H&P (Signed)
Reviewed paper H+P, will be scanned into chart. No changes noted.  

## 2017-11-27 NOTE — Anesthesia Post-op Follow-up Note (Signed)
Anesthesia QCDR form completed.        

## 2017-11-27 NOTE — OR Nursing (Signed)
1600 Walked to bathroom and voided.  Pain is better than before surgery.  "it still hurts, but not as bad".

## 2017-11-27 NOTE — Anesthesia Postprocedure Evaluation (Signed)
Anesthesia Post Note  Patient: Yvonne Knight  Procedure(s) Performed: Anne Ng (N/A Back)  Patient location during evaluation: PACU Anesthesia Type: MAC Level of consciousness: awake and alert Pain management: pain level controlled Vital Signs Assessment: post-procedure vital signs reviewed and stable Respiratory status: spontaneous breathing and respiratory function stable Cardiovascular status: stable Anesthetic complications: no     Last Vitals:  Vitals:   11/27/17 1204 11/27/17 1418  BP: (!) 160/65 (!) 124/56  Pulse: 86 91  Resp: 15 17  Temp: (!) 36.3 C (!) 36.1 C  SpO2: 97% 100%    Last Pain:  Vitals:   11/27/17 1418  TempSrc:   PainSc: Asleep                 KEPHART,WILLIAM K

## 2017-11-28 ENCOUNTER — Encounter: Payer: Self-pay | Admitting: Orthopedic Surgery

## 2017-11-29 LAB — SURGICAL PATHOLOGY

## 2018-03-11 ENCOUNTER — Other Ambulatory Visit: Payer: Self-pay | Admitting: Neurology

## 2018-03-11 DIAGNOSIS — S0990XA Unspecified injury of head, initial encounter: Secondary | ICD-10-CM

## 2018-03-15 ENCOUNTER — Ambulatory Visit
Admission: RE | Admit: 2018-03-15 | Discharge: 2018-03-15 | Disposition: A | Discharge status: Home / Self Care | Payer: Medicare Other | Source: Ambulatory Visit | Attending: Neurology | Admitting: Neurology

## 2018-03-15 DIAGNOSIS — X58XXXA Exposure to other specified factors, initial encounter: Secondary | ICD-10-CM | POA: Diagnosis not present

## 2018-03-15 DIAGNOSIS — S0990XA Unspecified injury of head, initial encounter: Secondary | ICD-10-CM | POA: Diagnosis present

## 2018-11-14 ENCOUNTER — Other Ambulatory Visit: Payer: Self-pay | Admitting: Internal Medicine

## 2018-11-14 ENCOUNTER — Ambulatory Visit
Admission: RE | Admit: 2018-11-14 | Discharge: 2018-11-14 | Disposition: A | Discharge status: Home / Self Care | Payer: Medicare Other | Source: Ambulatory Visit | Attending: Internal Medicine | Admitting: Internal Medicine

## 2018-11-14 ENCOUNTER — Other Ambulatory Visit (HOSPITAL_COMMUNITY): Payer: Self-pay | Admitting: Internal Medicine

## 2018-11-14 DIAGNOSIS — M7989 Other specified soft tissue disorders: Secondary | ICD-10-CM | POA: Diagnosis not present

## 2019-04-29 ENCOUNTER — Other Ambulatory Visit: Payer: Self-pay

## 2019-04-29 ENCOUNTER — Encounter: Payer: Self-pay | Admitting: Urology

## 2019-04-29 ENCOUNTER — Other Ambulatory Visit
Admission: RE | Admit: 2019-04-29 | Discharge: 2019-04-29 | Disposition: A | Discharge status: Home / Self Care | Payer: Medicare Other | Source: Ambulatory Visit | Attending: Urology | Admitting: Urology

## 2019-04-29 ENCOUNTER — Ambulatory Visit (INDEPENDENT_AMBULATORY_CARE_PROVIDER_SITE_OTHER): Payer: Medicare Other | Admitting: Urology

## 2019-04-29 ENCOUNTER — Encounter

## 2019-04-29 VITALS — BP 129/59 | HR 73 | Ht 59.0 in | Wt 133.0 lb

## 2019-04-29 DIAGNOSIS — R3121 Asymptomatic microscopic hematuria: Secondary | ICD-10-CM | POA: Diagnosis not present

## 2019-04-29 DIAGNOSIS — R3129 Other microscopic hematuria: Secondary | ICD-10-CM | POA: Diagnosis not present

## 2019-04-29 DIAGNOSIS — Z789 Other specified health status: Secondary | ICD-10-CM | POA: Diagnosis present

## 2019-04-29 DIAGNOSIS — R310 Gross hematuria: Secondary | ICD-10-CM | POA: Diagnosis present

## 2019-04-29 LAB — URINALYSIS, COMPLETE (UACMP) WITH MICROSCOPIC
Bilirubin Urine: NEGATIVE
Glucose, UA: NEGATIVE mg/dL
Ketones, ur: NEGATIVE mg/dL
Leukocytes,Ua: NEGATIVE
Nitrite: NEGATIVE
Protein, ur: NEGATIVE mg/dL
RBC / HPF: >50 RBC/hpf (ref 0–5)
Specific Gravity, Urine: 1.02 (ref 1.005–1.030)
pH: 5.5 (ref 5.0–8.0)

## 2019-04-29 NOTE — Progress Notes (Signed)
04/29/19 3:23 PM   Yvonne Knight [birth year] [id number]  Referring provider: Tracie Harrier, MD [address] [place],  [place] [address]  CC: Asymptomatic microscopic hematuria  HPI: I saw Yvonne Knight in urology clinic in consultation for asymptomatic microscopic hematuria from Dr. Marcelino Scot.  She is an extremely frail appearing 85 year old female with past medical history notable for congestive heart failure and recent DVT on Eliquis.  She was found to have microscopic hematuria with 10-50 RBCs on urinalysis 03/17/2019, and 4-10 RBCs in July 2020.  On chart review, she also had 6-30 RBCs on a urinalysis in September 2017 that was never evaluated.  She denies any abdominal pain, dysuria, or difficulty urinating.  She denies any flank pain, weight loss, or chest pain.  There is no recent cross-sectional imaging to review.  She denies any history of recurrent UTIs.  She denies any gross hematuria.  She is a never smoker, and denies any other carcinogenic exposures.  She is here with her son today, and he provides most of the history.   PMH: Past Medical History:  Diagnosis Date  . Arthritis   . CHF (congestive heart failure) ([place])   . HLD (hyperlipidemia)   . Hypertension   . Osteoporosis     Surgical History: Past Surgical History:  Procedure Laterality Date  . APPENDECTOMY    . CARPAL TUNNEL RELEASE    . CHOLECYSTECTOMY    . KYPHOPLASTY N/A 11/27/2017   Procedure: [id number];  Surgeon: Hessie Knows, MD;  Location: [facility] ORS;  Service: Orthopedics;  Laterality: N/A;  . REPLACEMENT TOTAL KNEE Left     Allergies: No Known Allergies  Family History: Family History  Problem Relation Age of Onset  . Stroke Mother   . Diabetes Sister     Social History:  reports that she has never smoked. She has never used smokeless tobacco. She reports that she does not drink alcohol or use drugs.  ROS: Please see flowsheet from today's date for complete  review of systems.  Physical Exam: BP (!) 129/59 (BP Location: Left Arm, Patient Position: Sitting, Cuff Size: Normal)   Pulse 73   Ht 4\' 11"  (1.499 m)   Wt 133 lb (60.3 kg)   BMI 26.86 kg/m    Constitutional: Wheelchair-bound, frail-appearing, hard of hearing Cardiovascular: No clubbing, cyanosis, or edema. Respiratory: Normal respiratory effort, no increased work of breathing. GI: Abdomen is soft, nontender, nondistended, no abdominal masses Lymph: No cervical or inguinal lymphadenopathy. Skin: No rashes, bruises or suspicious lesions. Psychiatric: Normal mood and affect.  Laboratory Data: Reviewed, see HPI  Pertinent Imaging: None to review  Assessment & Plan:   She is a extremely frail 85 year old female with multiple episodes of microscopic hematuria over the last 6 months, as well as a single episode in 2017.  She has no risk factors for urothelial cell carcinoma, and is completely asymptomatic.  We discussed common possible etiologies of microscopic hematuria including idiopathic, urolithiasis, medical renal disease, and malignancy. We discussed the new asymptomatic microscopic hematuria guidelines and risk categories of low, intermediate, and high risk that are based on age, risk factors like smoking, and degree of microscopic hematuria. We discussed work-up can range from repeat urinalysis, renal ultrasound and cystoscopy, to CT urogram and cystoscopy.  Though she falls into the high risk category for microscopic hematuria, with her age, co-morbidities, and extreme frailty, I recommended starting with a simple renal ultrasound prior to pursuing CT urogram or cystoscopy.  The patient and  her son are in agreement.  We discussed the low, but not 0, risk of missing a clinically significant process by starting with renal ultrasound alone.  Renal/bladder ultrasound, will call with results  A total of 45 minutes were spent face-to-face with the patient, greater than 50% was spent  in patient education, counseling, and coordination of care regarding asymptomatic microscopic hematuria.   Sondra Come, MD  [facility] [address], [address] [place], [place] [address] [phone]

## 2019-04-30 ENCOUNTER — Ambulatory Visit: Payer: Self-pay | Admitting: Urology

## 2019-05-02 ENCOUNTER — Telehealth: Payer: Self-pay | Admitting: Family Medicine

## 2019-05-02 NOTE — Telephone Encounter (Signed)
Patient's son called to find out if patient still had blood in her urine. I informed him that there is hematuria and there is a referral place for a renal ultrasound. The imaging department should be contacting them to set up an appointment. Patient's son voiced understanding.

## 2019-05-13 ENCOUNTER — Ambulatory Visit: Payer: Self-pay | Admitting: Urology

## 2019-05-23 ENCOUNTER — Other Ambulatory Visit: Payer: Self-pay

## 2019-05-23 ENCOUNTER — Ambulatory Visit
Admission: RE | Admit: 2019-05-23 | Discharge: 2019-05-23 | Disposition: A | Discharge status: Home / Self Care | Payer: Medicare Other | Source: Ambulatory Visit | Attending: Urology | Admitting: Urology

## 2019-05-23 DIAGNOSIS — R3129 Other microscopic hematuria: Secondary | ICD-10-CM | POA: Insufficient documentation

## 2019-05-27 ENCOUNTER — Telehealth: Payer: Self-pay | Admitting: Urology

## 2019-05-27 NOTE — Telephone Encounter (Signed)
Please advise. Thanks.  

## 2019-05-27 NOTE — Telephone Encounter (Signed)
Renal US was normal, no abnormalities. Would follow up as needed, sooner if gross hematuria  Thanks Legrand Rams, MD 05/27/2019

## 2019-05-27 NOTE — Telephone Encounter (Signed)
Called pt's son informed him of the information below. He gave verbal understanding.  

## 2019-05-27 NOTE — Telephone Encounter (Signed)
Pt's son called and would like a call back to discuss [place] results from 05/23/2019.

## 2019-10-21 ENCOUNTER — Inpatient Hospital Stay: Payer: Medicare Other

## 2019-10-21 ENCOUNTER — Emergency Department: Payer: Medicare Other

## 2019-10-21 ENCOUNTER — Inpatient Hospital Stay
Admission: EM | Admit: 2019-10-21 | Discharge: 2019-10-29 | DRG: 481 | Disposition: A | Discharge status: Skilled Nursing Facility | Payer: Medicare Other | Attending: Internal Medicine | Admitting: Internal Medicine

## 2019-10-21 ENCOUNTER — Other Ambulatory Visit: Payer: Self-pay

## 2019-10-21 DIAGNOSIS — M810 Age-related osteoporosis without current pathological fracture: Secondary | ICD-10-CM | POA: Diagnosis present

## 2019-10-21 DIAGNOSIS — E860 Dehydration: Secondary | ICD-10-CM | POA: Diagnosis present

## 2019-10-21 DIAGNOSIS — Z419 Encounter for procedure for purposes other than remedying health state, unspecified: Secondary | ICD-10-CM

## 2019-10-21 DIAGNOSIS — I5032 Chronic diastolic (congestive) heart failure: Secondary | ICD-10-CM | POA: Diagnosis present

## 2019-10-21 DIAGNOSIS — Y92009 Unspecified place in unspecified non-institutional (private) residence as the place of occurrence of the external cause: Secondary | ICD-10-CM | POA: Diagnosis not present

## 2019-10-21 DIAGNOSIS — S72001A Fracture of unspecified part of neck of right femur, initial encounter for closed fracture: Secondary | ICD-10-CM

## 2019-10-21 DIAGNOSIS — D631 Anemia in chronic kidney disease: Secondary | ICD-10-CM | POA: Diagnosis present

## 2019-10-21 DIAGNOSIS — Z823 Family history of stroke: Secondary | ICD-10-CM

## 2019-10-21 DIAGNOSIS — W010XXA Fall on same level from slipping, tripping and stumbling without subsequent striking against object, initial encounter: Secondary | ICD-10-CM | POA: Diagnosis present

## 2019-10-21 DIAGNOSIS — R5082 Postprocedural fever: Secondary | ICD-10-CM | POA: Diagnosis not present

## 2019-10-21 DIAGNOSIS — W19XXXA Unspecified fall, initial encounter: Secondary | ICD-10-CM

## 2019-10-21 DIAGNOSIS — N1831 Chronic kidney disease, stage 3a: Secondary | ICD-10-CM | POA: Diagnosis present

## 2019-10-21 DIAGNOSIS — D62 Acute posthemorrhagic anemia: Secondary | ICD-10-CM | POA: Diagnosis not present

## 2019-10-21 DIAGNOSIS — Z20822 Contact with and (suspected) exposure to covid-19: Secondary | ICD-10-CM | POA: Diagnosis present

## 2019-10-21 DIAGNOSIS — N179 Acute kidney failure, unspecified: Secondary | ICD-10-CM | POA: Diagnosis present

## 2019-10-21 DIAGNOSIS — Z86718 Personal history of other venous thrombosis and embolism: Secondary | ICD-10-CM | POA: Diagnosis not present

## 2019-10-21 DIAGNOSIS — M069 Rheumatoid arthritis, unspecified: Secondary | ICD-10-CM | POA: Diagnosis present

## 2019-10-21 DIAGNOSIS — Z7983 Long term (current) use of bisphosphonates: Secondary | ICD-10-CM

## 2019-10-21 DIAGNOSIS — T4145XA Adverse effect of unspecified anesthetic, initial encounter: Secondary | ICD-10-CM | POA: Diagnosis not present

## 2019-10-21 DIAGNOSIS — M1990 Unspecified osteoarthritis, unspecified site: Secondary | ICD-10-CM | POA: Diagnosis present

## 2019-10-21 DIAGNOSIS — E785 Hyperlipidemia, unspecified: Secondary | ICD-10-CM | POA: Diagnosis present

## 2019-10-21 DIAGNOSIS — I10 Essential (primary) hypertension: Secondary | ICD-10-CM | POA: Diagnosis not present

## 2019-10-21 DIAGNOSIS — Z833 Family history of diabetes mellitus: Secondary | ICD-10-CM

## 2019-10-21 DIAGNOSIS — I700 Atherosclerosis of aorta: Secondary | ICD-10-CM | POA: Diagnosis present

## 2019-10-21 DIAGNOSIS — S72009A Fracture of unspecified part of neck of unspecified femur, initial encounter for closed fracture: Secondary | ICD-10-CM

## 2019-10-21 DIAGNOSIS — F329 Major depressive disorder, single episode, unspecified: Secondary | ICD-10-CM | POA: Diagnosis present

## 2019-10-21 DIAGNOSIS — S72141A Displaced intertrochanteric fracture of right femur, initial encounter for closed fracture: Secondary | ICD-10-CM | POA: Diagnosis present

## 2019-10-21 DIAGNOSIS — R509 Fever, unspecified: Secondary | ICD-10-CM

## 2019-10-21 DIAGNOSIS — Z96652 Presence of left artificial knee joint: Secondary | ICD-10-CM | POA: Diagnosis present

## 2019-10-21 DIAGNOSIS — I130 Hypertensive heart and chronic kidney disease with heart failure and stage 1 through stage 4 chronic kidney disease, or unspecified chronic kidney disease: Secondary | ICD-10-CM | POA: Diagnosis present

## 2019-10-21 DIAGNOSIS — I952 Hypotension due to drugs: Secondary | ICD-10-CM | POA: Diagnosis not present

## 2019-10-21 DIAGNOSIS — S72144A Nondisplaced intertrochanteric fracture of right femur, initial encounter for closed fracture: Secondary | ICD-10-CM | POA: Diagnosis not present

## 2019-10-21 DIAGNOSIS — F32A Depression, unspecified: Secondary | ICD-10-CM | POA: Diagnosis present

## 2019-10-21 LAB — CBC WITH DIFFERENTIAL/PLATELET
Abs Immature Granulocytes: 0.05 10*3/uL (ref 0.00–0.07)
Basophils Absolute: 0 10*3/uL (ref 0.0–0.1)
Basophils Relative: 0 %
Eosinophils Absolute: 0.2 10*3/uL (ref 0.0–0.5)
Eosinophils Relative: 2 %
HCT: 38.9 % (ref 36.0–46.0)
Hemoglobin: 12.7 g/dL (ref 12.0–15.0)
Immature Granulocytes: 1 %
Lymphocytes Relative: 32 %
Lymphs Abs: 2.5 10*3/uL (ref 0.7–4.0)
MCH: 29.7 pg (ref 26.0–34.0)
MCHC: 32.6 g/dL (ref 30.0–36.0)
MCV: 91.1 fL (ref 80.0–100.0)
Monocytes Absolute: 0.6 10*3/uL (ref 0.1–1.0)
Monocytes Relative: 8 %
Neutro Abs: 4.6 10*3/uL (ref 1.7–7.7)
Neutrophils Relative %: 57 %
Platelets: 174 10*3/uL (ref 150–400)
RBC: 4.27 MIL/uL (ref 3.87–5.11)
RDW: 16.4 % — ABNORMAL HIGH (ref 11.5–15.5)
WBC: 8 10*3/uL (ref 4.0–10.5)
nRBC: 0 % (ref 0.0–0.2)

## 2019-10-21 LAB — SAMPLE TO BLOOD BANK

## 2019-10-21 LAB — COMPREHENSIVE METABOLIC PANEL
ALT: 15 U/L (ref 0–44)
AST: 20 U/L (ref 15–41)
Albumin: 3.7 g/dL (ref 3.5–5.0)
Alkaline Phosphatase: 54 U/L (ref 38–126)
Anion gap: 8 (ref 5–15)
BUN: 23 mg/dL (ref 8–23)
CO2: 30 mmol/L (ref 22–32)
Calcium: 8.7 mg/dL — ABNORMAL LOW (ref 8.9–10.3)
Chloride: 104 mmol/L (ref 98–111)
Creatinine, Ser: 1.27 mg/dL — ABNORMAL HIGH (ref 0.44–1.00)
GFR calc Af Amer: 42 mL/min — ABNORMAL LOW (ref >60)
GFR calc non Af Amer: 37 mL/min — ABNORMAL LOW (ref >60)
Glucose, Bld: 125 mg/dL — ABNORMAL HIGH (ref 70–99)
Potassium: 3.8 mmol/L (ref 3.5–5.1)
Sodium: 142 mmol/L (ref 135–145)
Total Bilirubin: 0.6 mg/dL (ref 0.3–1.2)
Total Protein: 6.3 g/dL — ABNORMAL LOW (ref 6.5–8.1)

## 2019-10-21 LAB — APTT: aPTT: 30 seconds (ref 24–36)

## 2019-10-21 LAB — BRAIN NATRIURETIC PEPTIDE: B Natriuretic Peptide: 397.5 pg/mL — ABNORMAL HIGH (ref 0.0–100.0)

## 2019-10-21 LAB — PROTIME-INR
INR: 1.1 (ref 0.8–1.2)
Prothrombin Time: 14.1 s (ref 11.4–15.2)

## 2019-10-21 LAB — SARS CORONAVIRUS 2 BY RT PCR (HOSPITAL ORDER, PERFORMED IN ~~LOC~~ HOSPITAL LAB): SARS Coronavirus 2: NEGATIVE

## 2019-10-21 MED ORDER — METHOTREXATE 2.5 MG PO TABS
10.0000 mg | ORAL_TABLET | ORAL | Status: DC
  Administered 2019-10-24: 10 mg via ORAL
  Filled 2019-10-21 (×2): qty 4

## 2019-10-21 MED ORDER — METHOCARBAMOL 500 MG PO TABS
500.0000 mg | ORAL_TABLET | Freq: Three times a day (TID) | ORAL | Status: DC | PRN
  Administered 2019-10-21: 500 mg via ORAL
  Filled 2019-10-21 (×2): qty 1

## 2019-10-21 MED ORDER — CEFAZOLIN SODIUM-DEXTROSE 2-4 GM/100ML-% IV SOLN
2.0000 g | INTRAVENOUS | Status: AC
  Administered 2019-10-22: 2 g via INTRAVENOUS

## 2019-10-21 MED ORDER — CITALOPRAM HYDROBROMIDE 20 MG PO TABS
20.0000 mg | ORAL_TABLET | Freq: Every day | ORAL | Status: DC
  Administered 2019-10-23 – 2019-10-29 (×7): 20 mg via ORAL
  Filled 2019-10-21 (×8): qty 1

## 2019-10-21 MED ORDER — CALCIUM CARBONATE-VITAMIN D 500-200 MG-UNIT PO TABS
1.0000 | ORAL_TABLET | Freq: Every day | ORAL | Status: DC
  Administered 2019-10-23 – 2019-10-29 (×7): 1 via ORAL
  Filled 2019-10-21 (×7): qty 1

## 2019-10-21 MED ORDER — ACETAMINOPHEN 325 MG PO TABS
650.0000 mg | ORAL_TABLET | Freq: Four times a day (QID) | ORAL | Status: DC | PRN
  Administered 2019-10-24 – 2019-10-25 (×2): 650 mg via ORAL
  Filled 2019-10-21 (×2): qty 2

## 2019-10-21 MED ORDER — OLANZAPINE 5 MG PO TABS
2.5000 mg | ORAL_TABLET | Freq: Every day | ORAL | Status: DC
  Administered 2019-10-21 – 2019-10-28 (×8): 2.5 mg via ORAL
  Filled 2019-10-21 (×10): qty 1

## 2019-10-21 MED ORDER — ONDANSETRON HCL 4 MG/2ML IJ SOLN
4.0000 mg | Freq: Once | INTRAMUSCULAR | Status: AC
  Administered 2019-10-21: 4 mg via INTRAVENOUS

## 2019-10-21 MED ORDER — VITAMIN B-12 1000 MCG PO TABS
1000.0000 ug | ORAL_TABLET | Freq: Every day | ORAL | Status: DC
  Administered 2019-10-23 – 2019-10-29 (×7): 1000 ug via ORAL
  Filled 2019-10-21 (×7): qty 1

## 2019-10-21 MED ORDER — OXYCODONE-ACETAMINOPHEN 5-325 MG PO TABS
1.0000 | ORAL_TABLET | ORAL | Status: DC | PRN
  Filled 2019-10-21: qty 1

## 2019-10-21 MED ORDER — MORPHINE SULFATE (PF) 2 MG/ML IV SOLN
INTRAVENOUS | Status: AC
  Administered 2019-10-21: 2 mg via INTRAVENOUS
  Filled 2019-10-21: qty 1

## 2019-10-21 MED ORDER — SENNOSIDES-DOCUSATE SODIUM 8.6-50 MG PO TABS: 1.0000 | ORAL_TABLET | Freq: Every evening | ORAL | Status: DC | PRN

## 2019-10-21 MED ORDER — MORPHINE SULFATE (PF) 2 MG/ML IV SOLN
0.5000 mg | INTRAVENOUS | Status: DC | PRN
  Administered 2019-10-21 (×2): 0.5 mg via INTRAVENOUS
  Filled 2019-10-21 (×2): qty 1

## 2019-10-21 MED ORDER — HYDRALAZINE HCL 20 MG/ML IJ SOLN
5.0000 mg | INTRAMUSCULAR | Status: DC | PRN
  Filled 2019-10-21: qty 0.25

## 2019-10-21 MED ORDER — AMLODIPINE BESYLATE 5 MG PO TABS: 5.0000 mg | ORAL_TABLET | Freq: Every day | ORAL | Status: DC

## 2019-10-21 MED ORDER — SODIUM CHLORIDE 0.9 % IV SOLN: INTRAVENOUS | Status: DC

## 2019-10-21 MED ORDER — ONDANSETRON HCL 4 MG/2ML IJ SOLN
4.0000 mg | Freq: Three times a day (TID) | INTRAMUSCULAR | Status: DC | PRN
  Administered 2019-10-22: 4 mg via INTRAVENOUS

## 2019-10-21 MED ORDER — ADULT MULTIVITAMIN W/MINERALS CH
1.0000 | ORAL_TABLET | Freq: Every day | ORAL | Status: DC
  Administered 2019-10-23 – 2019-10-29 (×7): 1 via ORAL
  Filled 2019-10-21 (×7): qty 1

## 2019-10-21 MED ORDER — VITAMIN D3 25 MCG (1000 UNIT) PO TABS
1000.0000 [IU] | ORAL_TABLET | Freq: Every day | ORAL | Status: DC
  Administered 2019-10-23 – 2019-10-29 (×7): 1000 [IU] via ORAL
  Filled 2019-10-21 (×14): qty 1

## 2019-10-21 MED ORDER — ONDANSETRON HCL 4 MG/2ML IJ SOLN
INTRAMUSCULAR | Status: AC
  Filled 2019-10-21: qty 2

## 2019-10-21 MED ORDER — MORPHINE SULFATE (PF) 2 MG/ML IV SOLN: 2.0000 mg | Freq: Once | INTRAVENOUS | Status: AC

## 2019-10-21 MED ORDER — FOLIC ACID 1 MG PO TABS
1.0000 mg | ORAL_TABLET | Freq: Every day | ORAL | Status: DC
  Administered 2019-10-23 – 2019-10-29 (×7): 1 mg via ORAL
  Filled 2019-10-21 (×7): qty 1

## 2019-10-21 NOTE — ED Notes (Signed)
ED Provider at bedside to update family 

## 2019-10-21 NOTE — Consult Note (Signed)
ORTHOPAEDIC CONSULTATION  REQUESTING PHYSICIAN: Lorretta Harp, MD  Chief Complaint: right hip status post fall  HPI: Yvonne Knight is a 86 y.o. female who complains of right hip pain after a fall at home.  Patient describes leaning forward, losing her balance and falling.  Patient was brought to the [facility] emergency department by EMS.  Patient denies other areas of pain although does admit that she hit her head when she fell but did not lose consciousness.    Past Medical History:  Diagnosis Date  . Arthritis   . CHF (congestive heart failure) ([facility])   . HLD (hyperlipidemia)   . Hypertension   . Osteoporosis    Past Surgical History:  Procedure Laterality Date  . APPENDECTOMY    . CARPAL TUNNEL RELEASE    . CHOLECYSTECTOMY    . KYPHOPLASTY N/A 11/27/2017   Procedure: [id number];  Surgeon: Kennedy Bucker, MD;  Location: [facility] ORS;  Service: Orthopedics;  Laterality: N/A;  . REPLACEMENT TOTAL KNEE Left    Social History   Socioeconomic History  . Marital status: Widowed    Spouse name: Not on file  . Number of children: Not on file  . Years of education: Not on file  . Highest education level: Not on file  Occupational History  . Not on file  Tobacco Use  . Smoking status: Never Smoker  . Smokeless tobacco: Never Used  Substance and Sexual Activity  . Alcohol use: No  . Drug use: No  . Sexual activity: Never  Other Topics Concern  . Not on file  Social History Narrative  . Not on file   Social Determinants of Health   Financial Resource Strain:   . Difficulty of Paying Living Expenses:   Food Insecurity:   . Worried About Programme researcher, broadcasting/film/video in the Last Year:   . Barista in the Last Year:   Transportation Needs:   . Freight forwarder (Medical):   Marland Kitchen Lack of Transportation (Non-Medical):   Physical Activity:   . Days of Exercise per Week:   . Minutes of Exercise per Session:   Stress:   . Feeling of Stress :   Social Connections:    . Frequency of Communication with Friends and Family:   . Frequency of Social Gatherings with Friends and Family:   . Attends Religious Services:   . Active Member of Clubs or Organizations:   . Attends Banker Meetings:   Marland Kitchen Marital Status:    Family History  Problem Relation Age of Onset  . Stroke Mother   . Diabetes Sister    No Known Allergies Prior to Admission medications   Medication Sig Start Date End Date Taking? Authorizing Provider  acetaminophen (TYLENOL) 500 MG tablet Take 500 mg by mouth in the morning and at bedtime.   Yes [provider]  amLODipine (NORVASC) 5 MG tablet Take 5 mg by mouth daily.    Yes [provider]  Calcium Carbonate-Vitamin D (CALCIUM 600+D PO) Take 1 tablet by mouth daily.   Yes [provider]  Cholecalciferol 25 MCG (1000 UT) tablet Take by mouth.   Yes [provider]  citalopram (CELEXA) 20 MG tablet Take 20 mg by mouth daily. 04/12/19  Yes [provider]  folic acid (FOLVITE) 1 MG tablet Take 1 mg by mouth daily. 04/28/19  Yes [provider]  furosemide (LASIX) 20 MG tablet Take 20 mg by mouth daily. 04/12/19  Yes [provider]  losartan (COZAAR) 25 MG tablet Take 25 mg by mouth daily.    Yes [provider]  methotrexate (RHEUMATREX) 2.5 MG tablet Take 10 mg by mouth once a week. 04/12/19  Yes [provider]  Multiple Vitamins-Minerals (PRESERVISION AREDS 2+MULTI VIT PO) Take by mouth.   Yes [provider]  OLANZapine (ZYPREXA) 2.5 MG tablet SMARTSIG:1 Tablet(s) By Mouth Every Night 06/27/19  Yes [provider]  potassium chloride (K-DUR) 10 MEQ tablet Take 10 mEq by mouth daily.   Yes [provider]  vitamin B-12 (CYANOCOBALAMIN) 1000 MCG tablet Take 1,000 mcg by mouth daily.   Yes [provider]  alendronate (FOSAMAX) 70 MG tablet Take 70 mg by mouth once a week. 10/23/17   [provider]   Bevacizumab (AVASTIN IV) Inject into the vein.    [provider]   DG Chest 1 View  Result Date: 10/21/2019 CLINICAL DATA:  Acute right hip fracture. EXAM: CHEST  1 VIEW COMPARISON:  02/07/2016 FINDINGS: Heart size is normal. Chronic aortic atherosclerosis. The lungs are clear. No edema. No infiltrate, collapse or effusion. Old healed right humeral fracture. Previously augmented T12 or L1 compression fracture. IMPRESSION: No active cardiopulmonary disease.  Aortic atherosclerosis. Electronically Signed   By: Paulina Fusi M.D.   On: 10/21/2019 13:44   CT HEAD WO CONTRAST  Result Date: 10/21/2019 CLINICAL DATA:  Fall EXAM: CT HEAD WITHOUT CONTRAST TECHNIQUE: Contiguous axial images were obtained from the base of the skull through the vertex without intravenous contrast. COMPARISON:  None. FINDINGS: Brain: There is no mass, hemorrhage or extra-axial collection. The size and configuration of the ventricles and extra-axial CSF spaces are normal. There is hypoattenuation of the white matter, most commonly indicating chronic small vessel disease. Vascular: Atherosclerotic calcification of the vertebral and internal carotid arteries at the skull base. No abnormal hyperdensity of the major intracranial arteries or dural venous sinuses. Skull: The visualized skull base, calvarium and extracranial soft tissues are normal. Sinuses/Orbits: No fluid levels or advanced mucosal thickening of the visualized paranasal sinuses. No mastoid or middle ear effusion. The orbits are normal. IMPRESSION: Chronic small vessel disease without acute intracranial abnormality. Electronically Signed   By: Deatra Robinson M.D.   On: 10/21/2019 19:41   DG Hip Unilat W or Wo Pelvis 2-3 Views Right  Result Date: 10/21/2019 CLINICAL DATA:  Fall with right hip pain. EXAM: DG HIP (WITH OR WITHOUT PELVIS) 2-3V RIGHT COMPARISON:  None. FINDINGS: Acute intertrochanteric to subtrochanteric fracture of the right femur with lateral  angulation. No evidence of pelvic fracture. IMPRESSION: Comminuted right intertrochanteric to subtrochanteric fracture with lateral angulation. Electronically Signed   By: Paulina Fusi M.D.   On: 10/21/2019 13:43    Positive ROS: All other systems have been reviewed and were otherwise negative with the exception of those mentioned in the HPI and as above.  Physical Exam: General: Alert, no acute distress  MUSCULOSKELETAL: Right lower extremity:  Shortened and externally rotated.  Skin intact.  NVI.  Assessment: Right comminuted reverse obliquity intertrochanteric hip fracture.    Plan: I reviewed the details of the operation as well as the postoperative course with the patient and her family.  Her son and grandson were at the bedside.  They understand the patient has a complex, comminuted fracture of the right hip.  I have recommended intramedullary fixation for this fracture.  I also discussed the risks and benefits of surgery. They understand the risks include but are not limited to  infection, bleeding requiring blood transfusion, nerve or blood vessel injury, joint stiffness or loss of motion, persistent pain, weakness or instability, malunion, nonunion and hardware failure and the need for further surgery. Medical risks include but are not limited to DVT and pulmonary embolism, especially given her history of DVT, myocardial infarction, stroke, pneumonia, respiratory failure and death. Patient and her family  understood these risks and wished to proceed.   I reviewed the patient's labs and radiographic studies in preparation for this case.  I have ordered a PTT, INR and a type and cross.  Surgery is tentatively scheduled for 1 PM tomorrow.  Patient is n.p.o. after midnight.  Patient should not receive any anticoagulation therapy in preparation for surgery tomorrow.   Thornton Park, MD    10/21/2019 8:05 PM

## 2019-10-21 NOTE — ED Provider Notes (Signed)
ER Provider Note       Time seen: 12:51 PM    I have reviewed the vital signs and the nursing notes.  HISTORY   Chief Complaint Fall   HPI Yvonne Knight is a 86 y.o. female with a history of arthritis, CHF, hyperlipidemia, hypertension who presents today for mechanical fall.  Patient is complaining of right hip pain.  Right hip is shortened and externally rotated.  She was given fentanyl in route by EMS with some pain control.  She denies any other injuries or complaints.  Past Medical History:  Diagnosis Date  . Arthritis   . CHF (congestive heart failure) (HCC)   . HLD (hyperlipidemia)   . Hypertension   . Osteoporosis     Past Surgical History:  Procedure Laterality Date  . APPENDECTOMY    . CARPAL TUNNEL RELEASE    . CHOLECYSTECTOMY    . KYPHOPLASTY N/A 11/27/2017   Procedure: [id number];  Surgeon: Kennedy Bucker, MD;  Location: [facility] ORS;  Service: Orthopedics;  Laterality: N/A;  . REPLACEMENT TOTAL KNEE Left     Allergies Patient has no known allergies.  Review of Systems Constitutional: Negative for fever. Cardiovascular: Negative for chest pain. Respiratory: Negative for shortness of breath. Gastrointestinal: Negative for abdominal pain, vomiting and diarrhea. Musculoskeletal: Positive for right hip pain Skin: Negative for rash. Neurological: Negative for headaches, focal weakness or numbness.  All systems negative/normal/unremarkable except as stated in the HPI  ____________________________________________   PHYSICAL EXAM:  VITAL SIGNS: Vitals:   10/21/19 1247  Pulse: 84  Resp: 18  SpO2: 97%    Constitutional: Alert and oriented. Well appearing and in no distress. Eyes: Conjunctivae are normal. Normal extraocular movements. ENT      Head: Normocephalic and atraumatic.      Nose: No congestion/rhinnorhea.      Mouth/Throat: Mucous membranes are moist.      Neck: No stridor. Cardiovascular: Normal rate, regular rhythm. No murmurs,  rubs, or gallops. Respiratory: Normal respiratory effort without tachypnea nor retractions. Breath sounds are clear and equal bilaterally. No wheezes/rales/rhonchi. Gastrointestinal: Soft and nontender. Normal bowel sounds Musculoskeletal: Nontender with normal range of motion in extremities. No lower extremity tenderness nor edema. Neurologic:  Normal speech and language. No gross focal neurologic deficits are appreciated.  Skin:  Skin is warm, dry and intact. No rash noted. Psychiatric: Speech and behavior are normal.  ____________________________________________  EKG: Interpreted by me.  Sinus rhythm with rate of 82 bpm, normal PR interval, normal QRS, normal QT  ____________________________________________   LABS (pertinent positives/negatives)  Labs Reviewed  CBC WITH DIFFERENTIAL/PLATELET - Abnormal; Notable for the following components:      Result Value   RDW 16.4 (*)    All other components within normal limits  COMPREHENSIVE METABOLIC PANEL - Abnormal; Notable for the following components:   Glucose, Bld 125 (*)    Creatinine, Ser 1.27 (*)    Calcium 8.7 (*)    Total Protein 6.3 (*)    GFR calc non Af Amer 37 (*)    GFR calc Af Amer 42 (*)    All other components within normal limits  SAMPLE TO BLOOD BANK    RADIOLOGY  Images were viewed by me Right hip x-rays, chest x-ray IMPRESSION: Comminuted right intertrochanteric to subtrochanteric fracture with lateral angulation. IMPRESSION: No active cardiopulmonary disease.  Aortic atherosclerosis.   DIFFERENTIAL DIAGNOSIS  Fall, hip fracture, pelvic fracture, femur fracture, dislocation  ASSESSMENT AND PLAN  Fall, right hip fracture  Plan: The patient had presented for fall with resulting right hip fracture. Patient's labs were grossly unremarkable, her x-rays revealed a right intertrochanteric to subtrochanteric fracture.  I discussed with orthopedics and the hospitalist service for admission.   Lenise Arena MD    Note: This note was generated in part or whole with voice recognition software. Voice recognition is usually quite accurate but there are transcription errors that can and very often do occur. I apologize for any typographical errors that were not detected and corrected.     Earleen Newport, MD 10/21/19 1400

## 2019-10-21 NOTE — ED Triage Notes (Signed)
Mechanical fall backwards, right hip pain shortening and rotation. fentanyl given piv PTA. Pt hypertensive with EMS

## 2019-10-21 NOTE — H&P (Signed)
History and Physical    Yvonne Knight JKK:[id number] DOB: 09-13-1925 DOA: 10/21/2019  Referring MD/NP/PA:   PCP: Barbette Reichmann, MD   Patient coming from:  The patient is coming from home.  At baseline, pt is partially dependent for most of ADL.        Chief Complaint: fall and right hip pain  HPI: Yvonne Knight is a 86 y.o. female with medical history significant of hypertension, hyperlipidemia, depression, dCHF, rheumatoid arthritis, CKD stage III, who presents with fall, right hip pain.  Per her grandson, pt accidentally tripped over her steps and fell backwards, injured her right hip, causing severe pain.  The pain is constant, sharp, moderate to severe, nonradiating.  Her right leg is shortened and externally rotated. Patient may have also injured her head, but no headache or neck pain.  Patient does not have chest pain, shortness breath, cough.  Denies nausea, vomiting, diarrhea, abdominal pain, symptoms of UTI.  No facial droop or slurred speech. Pt has hx of DVT and was taking Eliquis untill January 2021.  Currently patient is not taking any blood thinner.  ED Course: pt was found to have WBC 8.0, BNP 397, negative COVID-19 PCR, worsening renal function, temperature normal, blood pressure 161/67, heart rate 74, RR 22, oxygen saturation 94% on room air.  Chest x-ray negative.  X-ray of pelvis showed comminuted right intertrochanteric to subtrochanteric fracture with lateral angulation.  Patient is admitted to MedSurg bed as inpatient.  Orthopedic surgeon, Dr. Cicilia Clan is consulted  Review of Systems:   General: no fevers, chills, no body weight gain, has fatigue HEENT: no blurry vision, hearing changes or sore throat Respiratory: no dyspnea, coughing, wheezing CV: no chest pain, no palpitations GI: no nausea, vomiting, abdominal pain, diarrhea, constipation GU: no dysuria, burning on urination, increased urinary frequency, hematuria  Ext: has trace leg edema Neuro: no  unilateral weakness, numbness, or tingling, no vision change or hearing loss. Has fall. Skin: no rash, no skin tear. MSK: has right hip pain Heme: No easy bruising.  Travel history: No recent long distant travel.  Allergy: No Known Allergies  Past Medical History:  Diagnosis Date  . Arthritis   . CHF (congestive heart failure) (HCC)   . HLD (hyperlipidemia)   . Hypertension   . Osteoporosis     Past Surgical History:  Procedure Laterality Date  . APPENDECTOMY    . CARPAL TUNNEL RELEASE    . CHOLECYSTECTOMY    . KYPHOPLASTY N/A 11/27/2017   Procedure: [id number];  Surgeon: Kennedy Bucker, MD;  Location: [facility] ORS;  Service: Orthopedics;  Laterality: N/A;  . REPLACEMENT TOTAL KNEE Left     Social History:  reports that she has never smoked. She has never used smokeless tobacco. She reports that she does not drink alcohol or use drugs.  Family History:  Family History  Problem Relation Age of Onset  . Stroke Mother   . Diabetes Sister      Prior to Admission medications   Medication Sig Start Date End Date Taking? Authorizing Provider  acetaminophen (TYLENOL) 325 MG tablet Take 2 tablets (650 mg total) by mouth every 6 (six) hours as needed for mild pain (or Fever >/= 101). 02/08/16   Hugelmeyer, Alexis, DO  alendronate (FOSAMAX) 70 MG tablet Take 70 mg by mouth once a week. 10/23/17   [provider]  amLODipine (NORVASC) 10 MG tablet Take 10 mg by mouth daily.    [provider]  aspirin 325 MG EC tablet  Take 325 mg by mouth daily.    [provider]  Bevacizumab (AVASTIN IV) Inject into the vein.    [provider]  Calcium Carb-Cholecalciferol (OYSTER SHELL CALCIUM) 500-400 MG-UNIT TABS Take by mouth.    [provider]  Calcium Carbonate-Vitamin D (CALCIUM 600+D PO) Take 1 tablet by mouth daily.    [provider]  Cholecalciferol 25 MCG (1000 UT) tablet Take by mouth.    [provider]  citalopram (CELEXA)  20 MG tablet Take 20 mg by mouth daily. 04/12/19   [provider]  ELIQUIS 2.5 MG TABS tablet SMARTSIG:1 Tablet(s) By Mouth Every 12 Hours 04/04/19   [provider]  folic acid (FOLVITE) 1 MG tablet Take 1 mg by mouth daily. 04/28/19   [provider]  furosemide (LASIX) 20 MG tablet Take 20 mg by mouth daily. 04/12/19   [provider]  losartan (COZAAR) 25 MG tablet Take 12.5 mg by mouth daily.     [provider]  methotrexate (RHEUMATREX) 2.5 MG tablet Take 10 mg by mouth once a week. 04/12/19   [provider]  potassium chloride (K-DUR) 10 MEQ tablet Take 10 mEq by mouth daily.    [provider]  QUEtiapine (SEROQUEL) 25 MG tablet Take 12.5 mg by mouth at bedtime. 11/13/17 05/12/18  [provider]  risperiDONE (RISPERDAL) 1 MG tablet Take 1.5 mg by mouth at bedtime. 04/25/19   [provider]    Physical Exam: Vitals:   10/21/19 1630 10/21/19 1645 10/21/19 1700 10/21/19 1815  BP: (!) 144/53  (!) 141/56 (!) 159/68  Pulse: 76  74 80  Resp: 19 12  16   Temp:    97.9 F (36.6 C)  TempSrc:    Oral  SpO2: 94%  96% 93%  Weight:      Height:       General: Not in acute distress HEENT:       Eyes: PERRL, EOMI, no scleral icterus.       ENT: No discharge from the ears and nose, no pharynx injection, no tonsillar enlargement.        Neck: No JVD, no bruit, no mass felt. Heme: No neck lymph node enlargement. Cardiac: S1/S2, RRR, No murmurs, No gallops or rubs. Respiratory: No rales, wheezing, rhonchi or rubs. GI: Soft, nondistended, nontender, no rebound pain, no organomegaly, BS present. GU: No hematuria Ext: has trace leg edema bilaterally. 1+DP/PT pulse bilaterally. Musculoskeletal: has tenderness in the right hip. Right leg is shortened and externally rotated Skin: No rashes.  Neuro: Alert, oriented X3, cranial nerves II-XII grossly intact, moves all extremities normally.  Psych: Patient is not  psychotic, no suicidal or hemocidal ideation.  Labs on Admission: I have personally reviewed following labs and imaging studies  CBC: Recent Labs  Lab 10/21/19 1250  WBC 8.0  NEUTROABS 4.6  HGB 12.7  HCT 38.9  MCV 91.1  PLT 174   Basic Metabolic Panel: Recent Labs  Lab 10/21/19 1250  NA 142  K 3.8  CL 104  CO2 30  GLUCOSE 125*  BUN 23  CREATININE 1.27*  CALCIUM 8.7*   GFR: Estimated Creatinine Clearance: 25.3 mL/min (A) (by C-G formula based on SCr of 1.27 mg/dL (H)). Liver Function Tests: Recent Labs  Lab 10/21/19 1250  AST 20  ALT 15  ALKPHOS 54  BILITOT 0.6  PROT 6.3*  ALBUMIN 3.7   No results for input(s): LIPASE, AMYLASE in the last 168 hours. No results for input(s):  AMMONIA in the last 168 hours. Coagulation Profile: No results for input(s): INR, PROTIME in the last 168 hours. Cardiac Enzymes: No results for input(s): CKTOTAL, CKMB, CKMBINDEX, TROPONINI in the last 168 hours. BNP (last 3 results) No results for input(s): PROBNP in the last 8760 hours. HbA1C: No results for input(s): HGBA1C in the last 72 hours. CBG: No results for input(s): GLUCAP in the last 168 hours. Lipid Profile: No results for input(s): CHOL, HDL, LDLCALC, TRIG, CHOLHDL, LDLDIRECT in the last 72 hours. Thyroid Function Tests: No results for input(s): TSH, T4TOTAL, FREET4, T3FREE, THYROIDAB in the last 72 hours. Anemia Panel: No results for input(s): VITAMINB12, FOLATE, FERRITIN, TIBC, IRON, RETICCTPCT in the last 72 hours. Urine analysis:    Component Value Date/Time   COLORURINE YELLOW 04/29/2019 1520   APPEARANCEUR CLEAR 04/29/2019 1520   LABSPEC 1.020 04/29/2019 1520   PHURINE 5.5 04/29/2019 1520   GLUCOSEU NEGATIVE 04/29/2019 1520   HGBUR MODERATE (A) 04/29/2019 1520   BILIRUBINUR NEGATIVE 04/29/2019 1520   KETONESUR NEGATIVE 04/29/2019 1520   PROTEINUR NEGATIVE 04/29/2019 1520   NITRITE NEGATIVE 04/29/2019 1520   LEUKOCYTESUR NEGATIVE 04/29/2019 1520   Sepsis  Labs: @LABRCNTIP (procalcitonin:4,lacticidven:4) ) Recent Results (from the past 240 hour(s))  SARS Coronavirus 2 by RT PCR (hospital order, performed in [facility] hospital lab) Nasopharyngeal Nasopharyngeal Swab     Status: None   Collection Time: 10/21/19  2:09 PM   Specimen: Nasopharyngeal Swab  Result Value Ref Range Status   SARS Coronavirus 2 NEGATIVE NEGATIVE Final    Comment: (NOTE) SARS-CoV-2 target nucleic acids are NOT DETECTED. The SARS-CoV-2 RNA is generally detectable in upper and lower respiratory specimens during the acute phase of infection. The lowest concentration of SARS-CoV-2 viral copies this assay can detect is 250 copies / mL. A negative result does not preclude SARS-CoV-2 infection and should not be used as the sole basis for treatment or other patient management decisions.  A negative result may occur with improper specimen collection / handling, submission of specimen other than nasopharyngeal swab, presence of viral mutation(s) within the areas targeted by this assay, and inadequate number of viral copies (<250 copies / mL). A negative result must be combined with clinical observations, patient history, and epidemiological information. Fact Sheet for Patients:   BoilerBrush.com.cy Fact Sheet for Healthcare Providers: https://pope.com/ This test is not yet approved or cleared  by the [place] FDA and has been authorized for detection and/or diagnosis of SARS-CoV-2 by FDA under an Emergency Use Authorization (EUA).  This EUA will remain in effect (meaning this test can be used) for the duration of the COVID-19 declaration under Section 564(b)(1) of the Act, 21 U.S.C. section 360bbb-3(b)(1), unless the authorization is terminated or revoked sooner. Performed at [facility], [address] [place], [place] [address]      Radiological Exams on Admission: DG Chest 1 View  Result Date:  10/21/2019 CLINICAL DATA:  Acute right hip fracture. EXAM: CHEST  1 VIEW COMPARISON:  02/07/2016 FINDINGS: Heart size is normal. Chronic aortic atherosclerosis. The lungs are clear. No edema. No infiltrate, collapse or effusion. Old healed right humeral fracture. Previously augmented T12 or L1 compression fracture. IMPRESSION: No active cardiopulmonary disease.  Aortic atherosclerosis. Electronically Signed   By: Paulina Fusi M.D.   On: 10/21/2019 13:44   DG Hip Unilat W or Wo Pelvis 2-3 Views Right  Result Date: 10/21/2019 CLINICAL DATA:  Fall with right hip pain. EXAM: DG HIP (WITH OR WITHOUT PELVIS) 2-3V RIGHT COMPARISON:  None. FINDINGS: Acute intertrochanteric to subtrochanteric fracture of the right femur with lateral angulation. No evidence of pelvic fracture. IMPRESSION: Comminuted right intertrochanteric to subtrochanteric fracture with lateral angulation. Electronically Signed   By: Nelson Chimes M.D.   On: 10/21/2019 13:43     EKG: Independently reviewed.  Sinus rhythm, QTC 461, nonspecific to change  Assessment/Plan Principal Problem:   Intertrochanteric fracture of right hip (HCC) Active Problems:   HTN (hypertension)   Chronic diastolic CHF (congestive heart failure) (HCC)   Depression   Acute renal failure superimposed on stage 3a chronic kidney disease ([place])   Fall at home, initial encounter   Rheumatoid arthritis ([place])   Intertrochanteric fracture of right hip ([place]): X-ray of pelvis showed comminuted right intertrochanteric to subtrochanteric fracture with lateral angulation. No neurovascular compromise. Orthopedic surgeon, Dr. Mack Guise was consulted.   - will admit to Med-surg bed - Pain control: morphine prn and percocet - When necessary Zofran for nausea - Robaxin for muscle spasm - type and cross - INR/PTT -PT/OT when able to (not ordered now)  HTN:  -Continue home medications: Amlodipine -Hold Cozaar and Lasix due to worsening renal function -hydralazine  prn  Chronic diastolic CHF (congestive heart failure) ([place]): 2D echo on 07/30/2018 showed EF >55%.  Patient has a trace leg edema, but no JVD.  No shortness of breath.  Chest x-ray has no pulmonary edema.  BNP is elevated 397.  Patient does not have CHF exacerbation -Hold Lasix due to worsening renal function  Depression: -Celexa, olanzapine  Acute renal failure superimposed on stage 3a chronic kidney disease ([place]): Baseline Cre is 0.8 on 07/23/19, pt's Cre is 1.27  and BUN  23 on admission. Likely due to dehydration and continuation of ARB, diuretics - IVF : 50 cc/h of NS  - Follow up renal function by BMP - Avoid using renal toxic medications, hypotension and contrast dye (or carefully use) - Hold Lasix and Cozaar  Fall at home, initial encounter: -f/u CT-head -pt/ot when able to  Rheumatoid arthritis: -Continue methotrexate  Perioperative Cardiac Risk: Patient has multiple comorbidities, including hypertension, hyperlipidemia, dCHF, depression, rheumatoid arthritis, CKD-3. Currently patient is partially dependent.  She is taking Lasix. No recent acute cardiac issues.  No history of CAD or heart attack.  Patient does not have chest pain, shortness of breath, palpitation, has mild leg edema.  No signs of acute CHF exacerbation currently.  EKG has no acute change.  2D echo on 07/30/2018 showed EF >55%. At this time point, no further work up is needed. His GUPTA score perioperative myocardial infarction or cardaic arrest is 1.97% which is at least moderate risk.  I discussed with patient's grandson, they would like to proceed for surgery.   DVT ppx: SCD Code Status: Full code Family Communication: Yes, patient's grandson at bed side Disposition Plan:  Anticipate discharge back to previous environment Consults called: Dr. Mack Guise of Ortho Admission status: Med-surg bed as inpt         Status is: Inpatient  Remains inpatient appropriate because:Inpatient level of care appropriate due  to severity of illness   Dispo: The patient is from: Home              Anticipated d/c is to: to be determined              Anticipated d/c date is: 2 days              Patient currently is not medically stable to d/c.  Date of Service 10/21/2019    Lorretta Harp [facility]   If 7PM-7AM, please contact night-coverage [email or web address] 10/21/2019, 7:03 PM

## 2019-10-22 ENCOUNTER — Inpatient Hospital Stay: Payer: Medicare Other | Admitting: Anesthesiology

## 2019-10-22 ENCOUNTER — Encounter
Admission: EM | Disposition: A | Discharge status: Skilled Nursing Facility | Payer: Self-pay | Source: Home / Self Care | Attending: Hospitalist

## 2019-10-22 ENCOUNTER — Inpatient Hospital Stay: Payer: Medicare Other

## 2019-10-22 ENCOUNTER — Encounter: Payer: Self-pay | Admitting: Internal Medicine

## 2019-10-22 DIAGNOSIS — S72144A Nondisplaced intertrochanteric fracture of right femur, initial encounter for closed fracture: Secondary | ICD-10-CM

## 2019-10-22 HISTORY — PX: INTRAMEDULLARY (IM) NAIL INTERTROCHANTERIC: SHX5875

## 2019-10-22 LAB — BASIC METABOLIC PANEL
Anion gap: 5 (ref 5–15)
BUN: 26 mg/dL — ABNORMAL HIGH (ref 8–23)
CO2: 31 mmol/L (ref 22–32)
Calcium: 8.3 mg/dL — ABNORMAL LOW (ref 8.9–10.3)
Chloride: 107 mmol/L (ref 98–111)
Creatinine, Ser: 1.14 mg/dL — ABNORMAL HIGH (ref 0.44–1.00)
GFR calc Af Amer: 48 mL/min — ABNORMAL LOW (ref >60)
GFR calc non Af Amer: 42 mL/min — ABNORMAL LOW (ref >60)
Glucose, Bld: 175 mg/dL — ABNORMAL HIGH (ref 70–99)
Potassium: 3.9 mmol/L (ref 3.5–5.1)
Sodium: 143 mmol/L (ref 135–145)

## 2019-10-22 LAB — CBC
HCT: 32.3 % — ABNORMAL LOW (ref 36.0–46.0)
Hemoglobin: 10.5 g/dL — ABNORMAL LOW (ref 12.0–15.0)
MCH: 29.8 pg (ref 26.0–34.0)
MCHC: 32.5 g/dL (ref 30.0–36.0)
MCV: 91.8 fL (ref 80.0–100.0)
Platelets: 162 10*3/uL (ref 150–400)
RBC: 3.52 MIL/uL — ABNORMAL LOW (ref 3.87–5.11)
RDW: 16.3 % — ABNORMAL HIGH (ref 11.5–15.5)
WBC: 7.9 10*3/uL (ref 4.0–10.5)
nRBC: 0 % (ref 0.0–0.2)

## 2019-10-22 LAB — MRSA PCR SCREENING: MRSA by PCR: NEGATIVE

## 2019-10-22 SURGERY — FIXATION, FRACTURE, INTERTROCHANTERIC, WITH INTRAMEDULLARY ROD
Anesthesia: Spinal | Site: Hip | Laterality: Right

## 2019-10-22 MED ORDER — FENTANYL CITRATE (PF) 100 MCG/2ML IJ SOLN
INTRAMUSCULAR | Status: DC | PRN
  Administered 2019-10-22: 100 ug via INTRAVENOUS

## 2019-10-22 MED ORDER — ACETAMINOPHEN 10 MG/ML IV SOLN
INTRAVENOUS | Status: DC | PRN
Start: 2019-10-22 — End: 2019-10-22
  Administered 2019-10-22: 1000 mg via INTRAVENOUS

## 2019-10-22 MED ORDER — SODIUM CHLORIDE 0.9 % IR SOLN
Status: DC | PRN
  Administered 2019-10-22: 250 mL

## 2019-10-22 MED ORDER — KETOROLAC TROMETHAMINE 15 MG/ML IJ SOLN
7.5000 mg | Freq: Four times a day (QID) | INTRAMUSCULAR | Status: AC
  Administered 2019-10-22 – 2019-10-23 (×4): 7.5 mg via INTRAVENOUS
  Filled 2019-10-22 (×4): qty 1

## 2019-10-22 MED ORDER — ALUM & MAG HYDROXIDE-SIMETH 200-200-20 MG/5ML PO SUSP: 30.0000 mL | ORAL | Status: DC | PRN

## 2019-10-22 MED ORDER — ONDANSETRON HCL 4 MG/2ML IJ SOLN: 4.0000 mg | Freq: Once | INTRAMUSCULAR | Status: DC | PRN

## 2019-10-22 MED ORDER — ACETAMINOPHEN 10 MG/ML IV SOLN
INTRAVENOUS | Status: AC
  Filled 2019-10-22: qty 100

## 2019-10-22 MED ORDER — FUROSEMIDE 10 MG/ML IJ SOLN: 10.0000 mg | Freq: Once | INTRAMUSCULAR | Status: AC

## 2019-10-22 MED ORDER — PHENYLEPHRINE HCL (PRESSORS) 10 MG/ML IV SOLN
100.0000 ug | INTRAVENOUS | Status: DC | PRN
  Administered 2019-10-22 (×2): 25 ug via INTRAVENOUS

## 2019-10-22 MED ORDER — BISACODYL 10 MG RE SUPP
10.0000 mg | Freq: Every day | RECTAL | Status: DC | PRN
  Administered 2019-10-27: 10 mg via RECTAL
  Filled 2019-10-22: qty 1

## 2019-10-22 MED ORDER — EPINEPHRINE PF 1 MG/ML IJ SOLN
INTRAMUSCULAR | Status: AC
  Filled 2019-10-22: qty 1

## 2019-10-22 MED ORDER — SODIUM CHLORIDE 0.9 % IV SOLN
25.0000 ug/min | INTRAVENOUS | Status: DC
  Administered 2019-10-22: 25 ug/min via INTRAVENOUS
  Administered 2019-10-23: 45 ug/min via INTRAVENOUS
  Administered 2019-10-23: 48 ug/min via INTRAVENOUS
  Administered 2019-10-23 (×3): 55 ug/min via INTRAVENOUS
  Filled 2019-10-22: qty 10
  Filled 2019-10-22: qty 1
  Filled 2019-10-22: qty 10
  Filled 2019-10-22: qty 1
  Filled 2019-10-22 (×2): qty 10

## 2019-10-22 MED ORDER — MAGNESIUM CITRATE PO SOLN
1.0000 | Freq: Once | ORAL | Status: DC | PRN
  Filled 2019-10-22: qty 296

## 2019-10-22 MED ORDER — SENNA 8.6 MG PO TABS
1.0000 | ORAL_TABLET | Freq: Two times a day (BID) | ORAL | Status: DC
  Administered 2019-10-22 – 2019-10-28 (×13): 8.6 mg via ORAL
  Filled 2019-10-22 (×15): qty 1

## 2019-10-22 MED ORDER — GENTAMICIN SULFATE 40 MG/ML IJ SOLN
INTRAMUSCULAR | Status: AC
  Filled 2019-10-22: qty 2

## 2019-10-22 MED ORDER — ACETAMINOPHEN 500 MG PO TABS
500.0000 mg | ORAL_TABLET | Freq: Four times a day (QID) | ORAL | Status: AC
  Administered 2019-10-22 – 2019-10-23 (×4): 500 mg via ORAL
  Filled 2019-10-22 (×4): qty 1

## 2019-10-22 MED ORDER — CEFAZOLIN SODIUM-DEXTROSE 1-4 GM/50ML-% IV SOLN
1.0000 g | Freq: Four times a day (QID) | INTRAVENOUS | Status: AC
  Administered 2019-10-23: 1 g via INTRAVENOUS
  Filled 2019-10-22 (×3): qty 50

## 2019-10-22 MED ORDER — PHENYLEPHRINE HCL-NACL 10-0.9 MG/250ML-% IV SOLN: 0.0000 ug/min | INTRAVENOUS | Status: DC

## 2019-10-22 MED ORDER — CHLORHEXIDINE GLUCONATE CLOTH 2 % EX PADS
6.0000 | MEDICATED_PAD | Freq: Every day | CUTANEOUS | Status: DC
  Administered 2019-10-22 – 2019-10-29 (×8): 6 via TOPICAL

## 2019-10-22 MED ORDER — MORPHINE SULFATE (PF) 2 MG/ML IV SOLN: 0.5000 mg | INTRAVENOUS | Status: DC | PRN

## 2019-10-22 MED ORDER — SODIUM CHLORIDE 0.9 % IV SOLN
INTRAVENOUS | Status: DC | PRN
  Administered 2019-10-22: 100 ug via INTRAVENOUS
  Administered 2019-10-22: 35 ug/min via INTRAVENOUS

## 2019-10-22 MED ORDER — ONDANSETRON HCL 4 MG/2ML IJ SOLN: 4.0000 mg | Freq: Four times a day (QID) | INTRAMUSCULAR | Status: DC | PRN

## 2019-10-22 MED ORDER — FUROSEMIDE 10 MG/ML IJ SOLN
INTRAMUSCULAR | Status: AC
  Administered 2019-10-22: 10 mg via INTRAVENOUS
  Filled 2019-10-22: qty 2

## 2019-10-22 MED ORDER — ONDANSETRON HCL 4 MG PO TABS: 4.0000 mg | ORAL_TABLET | Freq: Four times a day (QID) | ORAL | Status: DC | PRN

## 2019-10-22 MED ORDER — LACTATED RINGERS IV BOLUS
500.0000 mL | Freq: Once | INTRAVENOUS | Status: AC
  Administered 2019-10-22: 500 mL via INTRAVENOUS

## 2019-10-22 MED ORDER — PROPOFOL 500 MG/50ML IV EMUL
INTRAVENOUS | Status: AC
  Filled 2019-10-22: qty 50

## 2019-10-22 MED ORDER — FENTANYL CITRATE (PF) 100 MCG/2ML IJ SOLN
INTRAMUSCULAR | Status: AC
  Filled 2019-10-22: qty 2

## 2019-10-22 MED ORDER — LACTATED RINGERS IV SOLN
INTRAVENOUS | Status: DC | PRN
Start: 2019-10-22 — End: 2019-10-22

## 2019-10-22 MED ORDER — HYDROCODONE-ACETAMINOPHEN 5-325 MG PO TABS
1.0000 | ORAL_TABLET | ORAL | Status: DC | PRN
  Administered 2019-10-24 – 2019-10-25 (×2): 1 via ORAL
  Administered 2019-10-27 – 2019-10-29 (×2): 2 via ORAL
  Filled 2019-10-22: qty 2
  Filled 2019-10-22 (×2): qty 1
  Filled 2019-10-22: qty 2

## 2019-10-22 MED ORDER — CEFAZOLIN SODIUM-DEXTROSE 1-4 GM/50ML-% IV SOLN
INTRAVENOUS | Status: AC
  Administered 2019-10-22: 1 g via INTRAVENOUS
  Filled 2019-10-22: qty 50

## 2019-10-22 MED ORDER — PROPOFOL 10 MG/ML IV BOLUS
INTRAVENOUS | Status: AC
  Filled 2019-10-22: qty 20

## 2019-10-22 MED ORDER — BUPIVACAINE HCL (PF) 0.25 % IJ SOLN
INTRAMUSCULAR | Status: AC
  Filled 2019-10-22: qty 30

## 2019-10-22 MED ORDER — ENOXAPARIN SODIUM 30 MG/0.3ML ~~LOC~~ SOLN
30.0000 mg | SUBCUTANEOUS | Status: DC
  Administered 2019-10-23 – 2019-10-24 (×2): 30 mg via SUBCUTANEOUS
  Filled 2019-10-22 (×2): qty 0.3

## 2019-10-22 MED ORDER — PHENYLEPHRINE HCL (PRESSORS) 10 MG/ML IV SOLN
INTRAVENOUS | Status: AC
  Administered 2019-10-22: 25 ug via INTRAVENOUS
  Filled 2019-10-22: qty 1

## 2019-10-22 MED ORDER — ENOXAPARIN SODIUM 40 MG/0.4ML ~~LOC~~ SOLN: 40.0000 mg | SUBCUTANEOUS | Status: DC

## 2019-10-22 MED ORDER — PROPOFOL 500 MG/50ML IV EMUL
INTRAVENOUS | Status: DC | PRN
  Administered 2019-10-22: 20 mg via INTRAVENOUS
  Administered 2019-10-22: 50 ug/kg/min via INTRAVENOUS

## 2019-10-22 MED ORDER — VASOPRESSIN 20 UNIT/ML IV SOLN
INTRAVENOUS | Status: DC | PRN
  Administered 2019-10-22: .5 [IU] via INTRAVENOUS

## 2019-10-22 MED ORDER — HYDROCODONE-ACETAMINOPHEN 7.5-325 MG PO TABS
1.0000 | ORAL_TABLET | ORAL | Status: DC | PRN
  Administered 2019-10-23 – 2019-10-24 (×2): 1 via ORAL
  Administered 2019-10-26 – 2019-10-28 (×2): 2 via ORAL
  Filled 2019-10-22: qty 1
  Filled 2019-10-22: qty 2
  Filled 2019-10-22: qty 1
  Filled 2019-10-22: qty 2

## 2019-10-22 MED ORDER — FENTANYL CITRATE (PF) 100 MCG/2ML IJ SOLN
INTRAMUSCULAR | Status: AC
  Administered 2019-10-22: 25 ug via INTRAVENOUS
  Filled 2019-10-22: qty 2

## 2019-10-22 MED ORDER — EPHEDRINE SULFATE 50 MG/ML IJ SOLN
INTRAMUSCULAR | Status: DC | PRN
  Administered 2019-10-22: 5 mg via INTRAVENOUS
  Administered 2019-10-22 (×3): 10 mg via INTRAVENOUS
  Administered 2019-10-22: 5 mg via INTRAVENOUS

## 2019-10-22 MED ORDER — DOCUSATE SODIUM 100 MG PO CAPS
100.0000 mg | ORAL_CAPSULE | Freq: Two times a day (BID) | ORAL | Status: DC
  Administered 2019-10-22 – 2019-10-28 (×13): 100 mg via ORAL
  Filled 2019-10-22 (×15): qty 1

## 2019-10-22 MED ORDER — FENTANYL CITRATE (PF) 100 MCG/2ML IJ SOLN
25.0000 ug | INTRAMUSCULAR | Status: DC | PRN
  Administered 2019-10-22 (×3): 25 ug via INTRAVENOUS

## 2019-10-22 MED ORDER — BUPIVACAINE HCL (PF) 0.5 % IJ SOLN
INTRAMUSCULAR | Status: DC | PRN
  Administered 2019-10-22: 3 mL

## 2019-10-22 MED ORDER — BUPIVACAINE-EPINEPHRINE (PF) 0.5% -1:200000 IJ SOLN
INTRAMUSCULAR | Status: AC
  Filled 2019-10-22: qty 30

## 2019-10-22 MED ORDER — PHENYLEPHRINE HCL-NACL 10-0.9 MG/250ML-% IV SOLN
25.0000 ug/min | INTRAVENOUS | Status: DC
  Filled 2019-10-22: qty 250

## 2019-10-22 MED ORDER — POLYETHYLENE GLYCOL 3350 17 G PO PACK: 17.0000 g | PACK | Freq: Every day | ORAL | Status: DC | PRN

## 2019-10-22 SURGICAL SUPPLY — 49 items
BIT DRILL 4.3MMS DISTAL GRDTED (BIT) ×1 IMPLANT
BNDG COHESIVE 4X5 TAN STRL (GAUZE/BANDAGES/DRESSINGS) ×9 IMPLANT
CANISTER SUCT 1200ML W/VALVE (MISCELLANEOUS) ×3 IMPLANT
CHLORAPREP W/TINT 26 (MISCELLANEOUS) ×6 IMPLANT
CORTICAL BONE SCR 5.0MM X 46MM (Screw) ×3 IMPLANT
COVER WAND RF STERILE (DRAPES) ×3 IMPLANT
DRAPE C-ARMOR (DRAPES) IMPLANT
DRAPE INCISE 23X17 IOBAN STRL (DRAPES) ×2
DRAPE INCISE IOBAN 23X17 STRL (DRAPES) ×1 IMPLANT
DRAPE SHEET LG 3/4 BI-LAMINATE (DRAPES) ×3 IMPLANT
DRAPE U-SHAPE 47X51 STRL (DRAPES) ×3 IMPLANT
DRAPE UTILITY 15X26 TOWEL STRL (DRAPES) ×6 IMPLANT
DRILL 4.3MMS DISTAL GRADUATED (BIT) ×3
DRSG AQUACEL AG ADV 3.5X10 (GAUZE/BANDAGES/DRESSINGS) IMPLANT
DRSG AQUACEL AG ADV 3.5X14 (GAUZE/BANDAGES/DRESSINGS) IMPLANT
DRSG OPSITE POSTOP 3X4 (GAUZE/BANDAGES/DRESSINGS) ×3 IMPLANT
DRSG OPSITE POSTOP 4X10 (GAUZE/BANDAGES/DRESSINGS) ×3 IMPLANT
ELECT REM PT RETURN 9FT ADLT (ELECTROSURGICAL) ×3
ELECTRODE REM PT RTRN 9FT ADLT (ELECTROSURGICAL) ×1 IMPLANT
GAUZE SPONGE 4X4 12PLY STRL (GAUZE/BANDAGES/DRESSINGS) ×3 IMPLANT
GAUZE XEROFORM 1X8 LF (GAUZE/BANDAGES/DRESSINGS) IMPLANT
GLOVE INDICATOR 8.0 STRL GRN (GLOVE) ×3 IMPLANT
GLOVE SURG ORTHO 8.5 STRL (GLOVE) ×3 IMPLANT
GOWN STRL REUS W/ TWL LRG LVL3 (GOWN DISPOSABLE) ×1 IMPLANT
GOWN STRL REUS W/TWL LRG LVL3 (GOWN DISPOSABLE) ×2
GOWN STRL REUS W/TWL LRG LVL4 (GOWN DISPOSABLE) ×3 IMPLANT
GUIDEPIN VERSANAIL DSP 3.2X444 (ORTHOPEDIC DISPOSABLE SUPPLIES) ×3 IMPLANT
GUIDEWIRE BALL NOSE 100CM (WIRE) ×3 IMPLANT
HFN RH 130 DEG 9MM X 340MM (Nail) ×3 IMPLANT
KIT TURNOVER KIT A (KITS) ×3 IMPLANT
MAT ABSORB  FLUID 56X50 GRAY (MISCELLANEOUS) ×2
MAT ABSORB FLUID 56X50 GRAY (MISCELLANEOUS) ×1 IMPLANT
NEEDLE SPNL 18GX3.5 QUINCKE PK (NEEDLE) ×3 IMPLANT
NS IRRIG 500ML POUR BTL (IV SOLUTION) ×3 IMPLANT
PACK HIP COMPR (MISCELLANEOUS) ×3 IMPLANT
SCREW CORTICL BON 5.0MM X 46MM (Screw) ×1 IMPLANT
SCREW LAG 10.5MMX105MM HFN (Screw) ×3 IMPLANT
SOL PREP PVP 2OZ (MISCELLANEOUS) ×3
SOLUTION PREP PVP 2OZ (MISCELLANEOUS) ×1 IMPLANT
STAPLER SKIN PROX 35W (STAPLE) ×3 IMPLANT
SUCTION FRAZIER HANDLE 10FR (MISCELLANEOUS) ×2
SUCTION TUBE FRAZIER 10FR DISP (MISCELLANEOUS) ×1 IMPLANT
SUT VIC AB 0 CT1 36 (SUTURE) ×6 IMPLANT
SUT VIC AB 0 CT2 27 (SUTURE) ×6 IMPLANT
SUT VIC AB 2-0 CT1 27 (SUTURE) ×2
SUT VIC AB 2-0 CT1 TAPERPNT 27 (SUTURE) ×1 IMPLANT
SUT VIC AB 2-0 SH 27 (SUTURE) ×2
SUT VIC AB 2-0 SH 27XBRD (SUTURE) ×1 IMPLANT
SYR 30ML LL (SYRINGE) ×3 IMPLANT

## 2019-10-22 NOTE — Anesthesia Procedure Notes (Signed)
Spinal  Patient location during procedure: OR Staffing Performed: resident/CRNA  Resident/CRNA: Staci Acosta, CRNA Preanesthetic Checklist Completed: patient identified, IV checked, site marked, risks and benefits discussed, surgical consent, monitors and equipment checked, pre-op evaluation and timeout performed Spinal Block Patient position: sitting Prep: Betadine Patient monitoring: heart rate, continuous pulse ox, blood pressure and cardiac monitor Approach: midline Location: L4-5 Injection technique: single-shot Needle Needle type: Quincke  Needle gauge: 22 G Needle length: 9 cm Assessment Sensory level: T6 Additional Notes Negative paresthesia. Negative blood return. Positive free-flowing CSF. Expiration date of kit checked and confirmed. Patient tolerated procedure well, without complications.

## 2019-10-22 NOTE — Anesthesia Preprocedure Evaluation (Signed)
Anesthesia Evaluation  Patient identified by MRN, date of birth, ID band Patient awake    Reviewed: Allergy & Precautions, NPO status , Patient's Chart, lab work & pertinent test results  History of Anesthesia Complications Negative for: history of anesthetic complications  Airway Mallampati: II     Mouth opening: Limited Mouth Opening  Dental   Pulmonary neg sleep apnea, neg COPD,           Cardiovascular hypertension, Pt. on medications +CHF (diastolic)  (-) Past MI (-) dysrhythmias (-) Valvular Problems/Murmurs     Neuro/Psych neg Seizures Depression    GI/Hepatic Neg liver ROS, neg GERD  ,  Endo/Other  neg diabetes  Renal/GU negative Renal ROS     Musculoskeletal   Abdominal   Peds  Hematology   Anesthesia Other Findings   Reproductive/Obstetrics                             Anesthesia Physical Anesthesia Plan  ASA: II  Anesthesia Plan: Spinal   Post-op Pain Management:    Induction:   PONV Risk Score and Plan:   Airway Management Planned:   Additional Equipment:   Intra-op Plan:   Post-operative Plan:   Informed Consent: I have reviewed the patients History and Physical, chart, labs and discussed the procedure including the risks, benefits and alternatives for the proposed anesthesia with the patient or authorized representative who has indicated his/her understanding and acceptance.       Plan Discussed with:   Anesthesia Plan Comments:         Anesthesia Quick Evaluation

## 2019-10-22 NOTE — Progress Notes (Signed)
PROGRESS NOTE    Yvonne Knight  OEH:[id number] DOB: April 26, 1926 DOA: 10/21/2019 PCP: Barbette Reichmann, MD    Assessment & Plan:   Principal Problem:   Intertrochanteric fracture of right hip (HCC) Active Problems:   HTN (hypertension)   Chronic diastolic CHF (congestive heart failure) (HCC)   Depression   Acute renal failure superimposed on stage 3a chronic kidney disease (HCC)   Fall at home, initial encounter   Rheumatoid arthritis (HCC)    Yvonne Knight is a 86 y.o. female with medical history significant of hypertension, hyperlipidemia, depression, dCHF, rheumatoid arthritis, CKD stage III, who presented with mechanical fall, right hip pain.  Intertrochanteric fracture of right hip [facility]): X-ray of pelvis showed comminuted right intertrochanteric to subtrochanteric fracture with lateral angulation. No neurovascular compromise. Orthopedic surgeon, Dr. Freya Clan was consulted.  PLAN: --RIGHT INTRAMEDULLARY (IM) NAIL INTERTROCHANTRIC today - Pain control: percocet PRN  - Robaxin for muscle spasm --PT/OT when able  Post-op hypotension likely due to anesthesia -blood loss ~150 ml.  Received about 2.5L IVF.  Neo 100 mcg x3 in the PACU.  BP 90's. PLAN: --due to age, multiple comorbidities, pt was transferred to stepdown for overnight monitoring. --continue MIVF@75  --Neo gtt titratable per nursing if BP drops --Hold all BP meds.  Chronic diastolic CHF (congestive heart failure) (HCC): 2D echo on 07/30/2018 showed EF >55%.  Patient has a trace leg edema, but no JVD.  No shortness of breath.  Chest x-ray has no pulmonary edema.  BNP is elevated 397.  Patient does not have CHF exacerbation -Hold Lasix due to worsening renal function and hypotension  Depression: -Celexa, olanzapine  Acute renal failure superimposed on stage 3a chronic kidney disease (HCC): Baseline Cre is 0.8 on 07/23/19, pt's Cre is 1.27  and BUN  23 on admission. Likely due to dehydration and  continuation of ARB, diuretics --continue MIVF@75  - Hold Lasix and Cozaar  Fall at home, initial encounter: -pt/ot when able to  Rheumatoid arthritis: -Continue methotrexate   DVT prophylaxis: Lovenox SQ Code Status: Full code  Family Communication: son and grandson updated at PACU today Status is: inpatient Dispo:   The patient is from: home Anticipated d/c is to: SNF rehab Anticipated d/c date is: 2-3 days Patient currently is not medically stable to d/c due to: hypotension just post-op, still need PT eval.   Subjective and Interval History:  Pt went for RIGHT INTRAMEDULLARY (IM) NAIL INTERTROCHANTRIC today.  Surgery went well, however, post-op, pt had hypotension, so received IVF and neo boluses and transferred to stepdown for overnight monitoring.  No fever, dyspnea, chest pain, abdominal pain, N/V/D.  Nursing reported decreased urine output.    Objective: Vitals:   10/22/19 1922 10/22/19 1932 10/22/19 1942 10/22/19 2005  BP: (!) 98/41 (!) 104/43 (!) 95/41 (!) 102/41  Pulse: 77 80 79 78  Resp: 18 20 14  (!) 9  Temp:    (!) 97.3 F (36.3 C)  TempSrc:    Oral  SpO2: 100% 100% 100% 95%  Weight:      Height:        Intake/Output Summary (Last 24 hours) at 10/22/2019 2020 Last data filed at 10/22/2019 1535 Gross per 24 hour  Intake 2191.09 ml  Output 350 ml  Net 1841.09 ml   Filed Weights   10/21/19 1248  Weight: 66.7 kg    Examination:   Constitutional: NAD, AAOx3, calm HEENT: conjunctivae and lids normal, EOMI CV: RRR no M,R,G. Distal pulses +2.  No cyanosis.   RESP:  CTA B/L, normal respiratory effort, on 2L sating 100% GI: +BS, NTND Extremities: No effusions, edema, or tenderness in BLE SKIN: warm, dry and intact Neuro: II - XII grossly intact.  Sensation intact   Data Reviewed: I have personally reviewed following labs and imaging studies  CBC: Recent Labs  Lab 10/21/19 1250 10/22/19 0429  WBC 8.0 7.9  NEUTROABS 4.6  --   HGB 12.7 10.5*    HCT 38.9 32.3*  MCV 91.1 91.8  PLT 174 162   Basic Metabolic Panel: Recent Labs  Lab 10/21/19 1250 10/22/19 0429  NA 142 143  K 3.8 3.9  CL 104 107  CO2 30 31  GLUCOSE 125* 175*  BUN 23 26*  CREATININE 1.27* 1.14*  CALCIUM 8.7* 8.3*   GFR: Estimated Creatinine Clearance: 28.2 mL/min (A) (by C-G formula based on SCr of 1.14 mg/dL (H)). Liver Function Tests: Recent Labs  Lab 10/21/19 1250  AST 20  ALT 15  ALKPHOS 54  BILITOT 0.6  PROT 6.3*  ALBUMIN 3.7   No results for input(s): LIPASE, AMYLASE in the last 168 hours. No results for input(s): AMMONIA in the last 168 hours. Coagulation Profile: Recent Labs  Lab 10/21/19 2020  INR 1.1   Cardiac Enzymes: No results for input(s): CKTOTAL, CKMB, CKMBINDEX, TROPONINI in the last 168 hours. BNP (last 3 results) No results for input(s): PROBNP in the last 8760 hours. HbA1C: No results for input(s): HGBA1C in the last 72 hours. CBG: No results for input(s): GLUCAP in the last 168 hours. Lipid Profile: No results for input(s): CHOL, HDL, LDLCALC, TRIG, CHOLHDL, LDLDIRECT in the last 72 hours. Thyroid Function Tests: No results for input(s): TSH, T4TOTAL, FREET4, T3FREE, THYROIDAB in the last 72 hours. Anemia Panel: No results for input(s): VITAMINB12, FOLATE, FERRITIN, TIBC, IRON, RETICCTPCT in the last 72 hours. Sepsis Labs: No results for input(s): PROCALCITON, LATICACIDVEN in the last 168 hours.  Recent Results (from the past 240 hour(s))  SARS Coronavirus 2 by RT PCR (hospital order, performed in [facility] hospital lab) Nasopharyngeal Nasopharyngeal Swab     Status: None   Collection Time: 10/21/19  2:09 PM   Specimen: Nasopharyngeal Swab  Result Value Ref Range Status   SARS Coronavirus 2 NEGATIVE NEGATIVE Final    Comment: (NOTE) SARS-CoV-2 target nucleic acids are NOT DETECTED. The SARS-CoV-2 RNA is generally detectable in upper and lower respiratory specimens during the acute phase of infection. The  lowest concentration of SARS-CoV-2 viral copies this assay can detect is 250 copies / mL. A negative result does not preclude SARS-CoV-2 infection and should not be used as the sole basis for treatment or other patient management decisions.  A negative result may occur with improper specimen collection / handling, submission of specimen other than nasopharyngeal swab, presence of viral mutation(s) within the areas targeted by this assay, and inadequate number of viral copies (<250 copies / mL). A negative result must be combined with clinical observations, patient history, and epidemiological information. Fact Sheet for Patients:   BoilerBrush.com.cy Fact Sheet for Healthcare Providers: https://pope.com/ This test is not yet approved or cleared  by the [place] FDA and has been authorized for detection and/or diagnosis of SARS-CoV-2 by FDA under an Emergency Use Authorization (EUA).  This EUA will remain in effect (meaning this test can be used) for the duration of the COVID-19 declaration under Section 564(b)(1) of the Act, 21 U.S.C. section 360bbb-3(b)(1), unless the authorization is terminated or revoked sooner. Performed at [facility], [phone]  [address], [place], [place] [address]       Radiology Studies: DG Chest 1 View  Result Date: 10/21/2019 CLINICAL DATA:  Acute right hip fracture. EXAM: CHEST  1 VIEW COMPARISON:  02/07/2016 FINDINGS: Heart size is normal. Chronic aortic atherosclerosis. The lungs are clear. No edema. No infiltrate, collapse or effusion. Old healed right humeral fracture. Previously augmented T12 or L1 compression fracture. IMPRESSION: No active cardiopulmonary disease.  Aortic atherosclerosis. Electronically Signed   By: Nelson Chimes M.D.   On: 10/21/2019 13:44   CT HEAD WO CONTRAST  Result Date: 10/21/2019 CLINICAL DATA:  Fall EXAM: CT HEAD WITHOUT CONTRAST TECHNIQUE: Contiguous axial images were  obtained from the base of the skull through the vertex without intravenous contrast. COMPARISON:  None. FINDINGS: Brain: There is no mass, hemorrhage or extra-axial collection. The size and configuration of the ventricles and extra-axial CSF spaces are normal. There is hypoattenuation of the white matter, most commonly indicating chronic small vessel disease. Vascular: Atherosclerotic calcification of the vertebral and internal carotid arteries at the skull base. No abnormal hyperdensity of the major intracranial arteries or dural venous sinuses. Skull: The visualized skull base, calvarium and extracranial soft tissues are normal. Sinuses/Orbits: No fluid levels or advanced mucosal thickening of the visualized paranasal sinuses. No mastoid or middle ear effusion. The orbits are normal. IMPRESSION: Chronic small vessel disease without acute intracranial abnormality. Electronically Signed   By: Ulyses Jarred M.D.   On: 10/21/2019 19:41   DG HIP OPERATIVE UNILAT W OR W/O PELVIS RIGHT  Result Date: 10/22/2019 CLINICAL DATA:  Right hip fracture. EXAM: OPERATIVE right HIP (WITH PELVIS IF PERFORMED) 4 VIEWS, POSTOPERATIVE VIEWS OF THE RIGHT HIP. TECHNIQUE: Fluoroscopic spot image(s) were submitted for interpretation post-operatively. COMPARISON:  Radiographs 10/21/2019 FINDINGS: Fluoroscopic intraoperative spot films and postoperative radiographs demonstrate a long intramedullary gamma nail in the femur with a proximal dynamic hip screw and a single distal interlocking screw. The dynamic hip screw appears to be in the fracture. There is approximately slightly more than 1 cortex width of medial displacement. IMPRESSION: Internal fixation of comminuted intertrochanteric fracture. Electronically Signed   By: Marijo Sanes M.D.   On: 10/22/2019 17:25   DG Hip Unilat W or Wo Pelvis 2-3 Views Right  Result Date: 10/21/2019 CLINICAL DATA:  Fall with right hip pain. EXAM: DG HIP (WITH OR WITHOUT PELVIS) 2-3V RIGHT  COMPARISON:  None. FINDINGS: Acute intertrochanteric to subtrochanteric fracture of the right femur with lateral angulation. No evidence of pelvic fracture. IMPRESSION: Comminuted right intertrochanteric to subtrochanteric fracture with lateral angulation. Electronically Signed   By: Nelson Chimes M.D.   On: 10/21/2019 13:43   DG FEMUR, MIN 2 VIEWS RIGHT  Result Date: 10/22/2019 CLINICAL DATA:  Right hip fracture. EXAM: OPERATIVE right HIP (WITH PELVIS IF PERFORMED) 4 VIEWS, POSTOPERATIVE VIEWS OF THE RIGHT HIP. TECHNIQUE: Fluoroscopic spot image(s) were submitted for interpretation post-operatively. COMPARISON:  Radiographs 10/21/2019 FINDINGS: Fluoroscopic intraoperative spot films and postoperative radiographs demonstrate a long intramedullary gamma nail in the femur with a proximal dynamic hip screw and a single distal interlocking screw. The dynamic hip screw appears to be in the fracture. There is approximately slightly more than 1 cortex width of medial displacement. IMPRESSION: Internal fixation of comminuted intertrochanteric fracture. Electronically Signed   By: Marijo Sanes M.D.   On: 10/22/2019 17:25     Scheduled Meds: . acetaminophen  500 mg Oral Q6H  . calcium-vitamin D  1 tablet Oral Daily  . Chlorhexidine Gluconate Cloth  6 each Topical Daily  . cholecalciferol  1,000 Units Oral Daily  . citalopram  20 mg Oral Daily  . docusate sodium  100 mg Oral BID  . [START ON 10/23/2019] enoxaparin (LOVENOX) injection  30 mg Subcutaneous Q24H  . folic acid  1 mg Oral Daily  . ketorolac  7.5 mg Intravenous Q6H  . [START ON 10/24/2019] methotrexate  10 mg Oral Weekly  . multivitamin with minerals  1 tablet Oral Daily  . OLANZapine  2.5 mg Oral QHS  . senna  1 tablet Oral BID  . vitamin B-12  1,000 mcg Oral Daily   Continuous Infusions: . sodium chloride Stopped (10/22/19 1334)  .  ceFAZolin (ANCEF) IV 1 g (10/22/19 1928)  . phenylephrine (NEO-SYNEPHRINE) Adult infusion       LOS: 1 day       Darlin Priestly, MD [facility] If 7PM-7AM, please contact night-coverage 10/22/2019, 8:20 PM

## 2019-10-22 NOTE — Progress Notes (Signed)
Subjective:  Patient was seen in PACU postop.  Her son and grandson are at the bedside.  Patient's RN reports patient has had hypotension postop.  Objective:   VITALS:   Vitals:   10/22/19 1857 10/22/19 1902 10/22/19 1907 10/22/19 1912  BP: (!) 96/40 (!) 100/43 (!) 101/41 (!) 98/40  Pulse: 78 80 81 80  Resp: 15 15 18 20   Temp:      TempSrc:      SpO2: 100% 100% 100% 100%  Weight:      Height:        PHYSICAL EXAM: Right lower extremity: Thigh compartments are soft and compressible.  Patient has no significant swelling.  There is no erythema or ecchymosis. Neurovascular intact Sensation intact distally Intact pulses distally Dorsiflexion/Plantar flexion intact Dressing: scant drainage No cellulitis present Compartment soft  LABS  Results for orders placed or performed during the hospital encounter of 10/21/19 (from the past 24 hour(s))  Protime-INR     Status: None   Collection Time: 10/21/19  8:20 PM  Result Value Ref Range   Prothrombin Time 14.1 11.4 - 15.2 seconds   INR 1.1 0.8 - 1.2  APTT     Status: None   Collection Time: 10/21/19  8:20 PM  Result Value Ref Range   aPTT 30 24 - 36 seconds  CBC     Status: Abnormal   Collection Time: 10/22/19  4:29 AM  Result Value Ref Range   WBC 7.9 4.0 - 10.5 K/uL   RBC 3.52 (L) 3.87 - 5.11 MIL/uL   Hemoglobin 10.5 (L) 12.0 - 15.0 g/dL   HCT 12/22/19 (L) 09.3 - 81.8 %   MCV 91.8 80.0 - 100.0 fL   MCH 29.8 26.0 - 34.0 pg   MCHC 32.5 30.0 - 36.0 g/dL   RDW 29.9 (H) 37.1 - 69.6 %   Platelets 162 150 - 400 K/uL   nRBC 0.0 0.0 - 0.2 %  Basic metabolic panel     Status: Abnormal   Collection Time: 10/22/19  4:29 AM  Result Value Ref Range   Sodium 143 135 - 145 mmol/L   Potassium 3.9 3.5 - 5.1 mmol/L   Chloride 107 98 - 111 mmol/L   CO2 31 22 - 32 mmol/L   Glucose, Bld 175 (H) 70 - 99 mg/dL   BUN 26 (H) 8 - 23 mg/dL   Creatinine, Ser 12/22/19 (H) 0.44 - 1.00 mg/dL   Calcium 8.3 (L) 8.9 - 10.3 mg/dL   GFR calc non Af Amer  42 (L) >60 mL/min   GFR calc Af Amer 48 (L) >60 mL/min   Anion gap 5 5 - 15    DG Chest 1 View  Result Date: 10/21/2019 CLINICAL DATA:  Acute right hip fracture. EXAM: CHEST  1 VIEW COMPARISON:  02/07/2016 FINDINGS: Heart size is normal. Chronic aortic atherosclerosis. The lungs are clear. No edema. No infiltrate, collapse or effusion. Old healed right humeral fracture. Previously augmented T12 or L1 compression fracture. IMPRESSION: No active cardiopulmonary disease.  Aortic atherosclerosis. Electronically Signed   By: 02/09/2016 M.D.   On: 10/21/2019 13:44   CT HEAD WO CONTRAST  Result Date: 10/21/2019 CLINICAL DATA:  Fall EXAM: CT HEAD WITHOUT CONTRAST TECHNIQUE: Contiguous axial images were obtained from the base of the skull through the vertex without intravenous contrast. COMPARISON:  None. FINDINGS: Brain: There is no mass, hemorrhage or extra-axial collection. The size and configuration of the ventricles and extra-axial CSF spaces are normal. There is  hypoattenuation of the white matter, most commonly indicating chronic small vessel disease. Vascular: Atherosclerotic calcification of the vertebral and internal carotid arteries at the skull base. No abnormal hyperdensity of the major intracranial arteries or dural venous sinuses. Skull: The visualized skull base, calvarium and extracranial soft tissues are normal. Sinuses/Orbits: No fluid levels or advanced mucosal thickening of the visualized paranasal sinuses. No mastoid or middle ear effusion. The orbits are normal. IMPRESSION: Chronic small vessel disease without acute intracranial abnormality. Electronically Signed   By: Ulyses Jarred M.D.   On: 10/21/2019 19:41   DG HIP OPERATIVE UNILAT W OR W/O PELVIS RIGHT  Result Date: 10/22/2019 CLINICAL DATA:  Right hip fracture. EXAM: OPERATIVE right HIP (WITH PELVIS IF PERFORMED) 4 VIEWS, POSTOPERATIVE VIEWS OF THE RIGHT HIP. TECHNIQUE: Fluoroscopic spot image(s) were submitted for interpretation  post-operatively. COMPARISON:  Radiographs 10/21/2019 FINDINGS: Fluoroscopic intraoperative spot films and postoperative radiographs demonstrate a long intramedullary gamma nail in the femur with a proximal dynamic hip screw and a single distal interlocking screw. The dynamic hip screw appears to be in the fracture. There is approximately slightly more than 1 cortex width of medial displacement. IMPRESSION: Internal fixation of comminuted intertrochanteric fracture. Electronically Signed   By: Marijo Sanes M.D.   On: 10/22/2019 17:25   DG Hip Unilat W or Wo Pelvis 2-3 Views Right  Result Date: 10/21/2019 CLINICAL DATA:  Fall with right hip pain. EXAM: DG HIP (WITH OR WITHOUT PELVIS) 2-3V RIGHT COMPARISON:  None. FINDINGS: Acute intertrochanteric to subtrochanteric fracture of the right femur with lateral angulation. No evidence of pelvic fracture. IMPRESSION: Comminuted right intertrochanteric to subtrochanteric fracture with lateral angulation. Electronically Signed   By: Nelson Chimes M.D.   On: 10/21/2019 13:43   DG FEMUR, MIN 2 VIEWS RIGHT  Result Date: 10/22/2019 CLINICAL DATA:  Right hip fracture. EXAM: OPERATIVE right HIP (WITH PELVIS IF PERFORMED) 4 VIEWS, POSTOPERATIVE VIEWS OF THE RIGHT HIP. TECHNIQUE: Fluoroscopic spot image(s) were submitted for interpretation post-operatively. COMPARISON:  Radiographs 10/21/2019 FINDINGS: Fluoroscopic intraoperative spot films and postoperative radiographs demonstrate a long intramedullary gamma nail in the femur with a proximal dynamic hip screw and a single distal interlocking screw. The dynamic hip screw appears to be in the fracture. There is approximately slightly more than 1 cortex width of medial displacement. IMPRESSION: Internal fixation of comminuted intertrochanteric fracture. Electronically Signed   By: Marijo Sanes M.D.   On: 10/22/2019 17:25    Assessment/Plan: Day of Surgery   Principal Problem:   Intertrochanteric fracture of right hip  (HCC) Active Problems:   HTN (hypertension)   Chronic diastolic CHF (congestive heart failure) (HCC)   Depression   Acute renal failure superimposed on stage 3a chronic kidney disease ([place])   Fall at home, initial encounter   Rheumatoid arthritis [facility])  Patient was evaluated by Dr. Billie Ruddy from the hospitalist service.  She recommended the patient spend the night in stepdown for close monitoring.  Dr. Billie Ruddy will place orders for transfer to the unit.  The family has been informed of this plan.  The patient is currently comfortable as long as she does not move her hip.  She is neurovascular intact postop.  I reviewed postoperative x-rays demonstrating the fracture appears well fixed and in good alignment.    Thornton Park , MD 10/22/2019, 7:37 PM

## 2019-10-22 NOTE — Progress Notes (Signed)
Subjective:  Patient is seen in the preoperative area.  Her son is at the bedside.  Patient states she is not having significant right hip pain at rest but has significant pain with movement of the right hip.  Objective:   VITALS:   Vitals:   10/22/19 0548 10/22/19 0803 10/22/19 1107 10/22/19 1159  BP: (!) 150/66 (!) 140/47 (!) 141/54 (!) 157/57  Pulse: 82 74 74 74  Resp: 17 16 16 18   Temp: 98.3 F (36.8 C) 98.1 F (36.7 C) 98.4 F (36.9 C) (!) 97.2 F (36.2 C)  TempSrc:  Oral Oral Temporal  SpO2: 95% 95% 93% 95%  Weight:      Height:        PHYSICAL EXAM: Right lower extremity Neurovascular intact Sensation intact distally Intact pulses distally Dorsiflexion/Plantar flexion intact No cellulitis present Compartment soft  LABS  Results for orders placed or performed during the hospital encounter of 10/21/19 (from the past 24 hour(s))  CBC with Differential/Platelet     Status: Abnormal   Collection Time: 10/21/19 12:50 PM  Result Value Ref Range   WBC 8.0 4.0 - 10.5 K/uL   RBC 4.27 3.87 - 5.11 MIL/uL   Hemoglobin 12.7 12.0 - 15.0 g/dL   HCT 38.9 36.0 - 46.0 %   MCV 91.1 80.0 - 100.0 fL   MCH 29.7 26.0 - 34.0 pg   MCHC 32.6 30.0 - 36.0 g/dL   RDW 16.4 (H) 11.5 - 15.5 %   Platelets 174 150 - 400 K/uL   nRBC 0.0 0.0 - 0.2 %   Neutrophils Relative % 57 %   Neutro Abs 4.6 1.7 - 7.7 K/uL   Lymphocytes Relative 32 %   Lymphs Abs 2.5 0.7 - 4.0 K/uL   Monocytes Relative 8 %   Monocytes Absolute 0.6 0.1 - 1.0 K/uL   Eosinophils Relative 2 %   Eosinophils Absolute 0.2 0.0 - 0.5 K/uL   Basophils Relative 0 %   Basophils Absolute 0.0 0.0 - 0.1 K/uL   Immature Granulocytes 1 %   Abs Immature Granulocytes 0.05 0.00 - 0.07 K/uL  Comprehensive metabolic panel     Status: Abnormal   Collection Time: 10/21/19 12:50 PM  Result Value Ref Range   Sodium 142 135 - 145 mmol/L   Potassium 3.8 3.5 - 5.1 mmol/L   Chloride 104 98 - 111 mmol/L   CO2 30 22 - 32 mmol/L   Glucose,  Bld 125 (H) 70 - 99 mg/dL   BUN 23 8 - 23 mg/dL   Creatinine, Ser 1.27 (H) 0.44 - 1.00 mg/dL   Calcium 8.7 (L) 8.9 - 10.3 mg/dL   Total Protein 6.3 (L) 6.5 - 8.1 g/dL   Albumin 3.7 3.5 - 5.0 g/dL   AST 20 15 - 41 U/L   ALT 15 0 - 44 U/L   Alkaline Phosphatase 54 38 - 126 U/L   Total Bilirubin 0.6 0.3 - 1.2 mg/dL   GFR calc non Af Amer 37 (L) >60 mL/min   GFR calc Af Amer 42 (L) >60 mL/min   Anion gap 8 5 - 15  Sample to Blood Bank     Status: None   Collection Time: 10/21/19 12:50 PM  Result Value Ref Range   Blood Bank Specimen SAMPLE AVAILABLE FOR TESTING    Sample Expiration      10/24/2019,2359 Performed at [facility], [address], [place], [place] [address]   Brain natriuretic peptide     Status: Abnormal  Collection Time: 10/21/19 12:50 PM  Result Value Ref Range   B Natriuretic Peptide 397.5 (H) 0.0 - 100.0 pg/mL  Type and screen [facility]     Status: None   Collection Time: 10/21/19 12:50 PM  Result Value Ref Range   ABO/RH(D) A POS    Antibody Screen NEG    Sample Expiration      10/24/2019,2359 Performed at [facility], [address] [place], [place] [address]   SARS Coronavirus 2 by RT PCR (hospital order, performed in [facility] hospital lab) Nasopharyngeal Nasopharyngeal Swab     Status: None   Collection Time: 10/21/19  2:09 PM   Specimen: Nasopharyngeal Swab  Result Value Ref Range   SARS Coronavirus 2 NEGATIVE NEGATIVE  Protime-INR     Status: None   Collection Time: 10/21/19  8:20 PM  Result Value Ref Range   Prothrombin Time 14.1 11.4 - 15.2 seconds   INR 1.1 0.8 - 1.2  APTT     Status: None   Collection Time: 10/21/19  8:20 PM  Result Value Ref Range   aPTT 30 24 - 36 seconds  CBC     Status: Abnormal   Collection Time: 10/22/19  4:29 AM  Result Value Ref Range   WBC 7.9 4.0 - 10.5 K/uL   RBC 3.52 (L) 3.87 - 5.11 MIL/uL   Hemoglobin 10.5 (L) 12.0 - 15.0 g/dL   HCT 32.9 (L) 92.4 - 26.8 %    MCV 91.8 80.0 - 100.0 fL   MCH 29.8 26.0 - 34.0 pg   MCHC 32.5 30.0 - 36.0 g/dL   RDW 34.1 (H) 96.2 - 22.9 %   Platelets 162 150 - 400 K/uL   nRBC 0.0 0.0 - 0.2 %  Basic metabolic panel     Status: Abnormal   Collection Time: 10/22/19  4:29 AM  Result Value Ref Range   Sodium 143 135 - 145 mmol/L   Potassium 3.9 3.5 - 5.1 mmol/L   Chloride 107 98 - 111 mmol/L   CO2 31 22 - 32 mmol/L   Glucose, Bld 175 (H) 70 - 99 mg/dL   BUN 26 (H) 8 - 23 mg/dL   Creatinine, Ser 7.98 (H) 0.44 - 1.00 mg/dL   Calcium 8.3 (L) 8.9 - 10.3 mg/dL   GFR calc non Af Amer 42 (L) >60 mL/min   GFR calc Af Amer 48 (L) >60 mL/min   Anion gap 5 5 - 15    DG Chest 1 View  Result Date: 10/21/2019 CLINICAL DATA:  Acute right hip fracture. EXAM: CHEST  1 VIEW COMPARISON:  02/07/2016 FINDINGS: Heart size is normal. Chronic aortic atherosclerosis. The lungs are clear. No edema. No infiltrate, collapse or effusion. Old healed right humeral fracture. Previously augmented T12 or L1 compression fracture. IMPRESSION: No active cardiopulmonary disease.  Aortic atherosclerosis. Electronically Signed   By: Paulina Fusi M.D.   On: 10/21/2019 13:44   CT HEAD WO CONTRAST  Result Date: 10/21/2019 CLINICAL DATA:  Fall EXAM: CT HEAD WITHOUT CONTRAST TECHNIQUE: Contiguous axial images were obtained from the base of the skull through the vertex without intravenous contrast. COMPARISON:  None. FINDINGS: Brain: There is no mass, hemorrhage or extra-axial collection. The size and configuration of the ventricles and extra-axial CSF spaces are normal. There is hypoattenuation of the white matter, most commonly indicating chronic small vessel disease. Vascular: Atherosclerotic calcification of the vertebral and internal carotid arteries at the skull base. No abnormal hyperdensity of the  major intracranial arteries or dural venous sinuses. Skull: The visualized skull base, calvarium and extracranial soft tissues are normal. Sinuses/Orbits: No fluid  levels or advanced mucosal thickening of the visualized paranasal sinuses. No mastoid or middle ear effusion. The orbits are normal. IMPRESSION: Chronic small vessel disease without acute intracranial abnormality. Electronically Signed   By: Deatra Robinson M.D.   On: 10/21/2019 19:41   DG Hip Unilat W or Wo Pelvis 2-3 Views Right  Result Date: 10/21/2019 CLINICAL DATA:  Fall with right hip pain. EXAM: DG HIP (WITH OR WITHOUT PELVIS) 2-3V RIGHT COMPARISON:  None. FINDINGS: Acute intertrochanteric to subtrochanteric fracture of the right femur with lateral angulation. No evidence of pelvic fracture. IMPRESSION: Comminuted right intertrochanteric to subtrochanteric fracture with lateral angulation. Electronically Signed   By: Paulina Fusi M.D.   On: 10/21/2019 13:43    Assessment/Plan: Day of Surgery   Principal Problem:   Intertrochanteric fracture of right hip (HCC) Active Problems:   HTN (hypertension)   Chronic diastolic CHF (congestive heart failure) (HCC)   Depression   Acute renal failure superimposed on stage 3a chronic kidney disease (HCC)   Fall at home, initial encounter   Rheumatoid arthritis [facility])  Patient has been cleared for surgery per the medicine service.  She has deemed moderate risk based on her age.  Patient will go individually fixation for her right comminuted intertrochanteric hip fracture.  I have marked the right hip according the hospital's quickset of surgery protocol.  I reviewed the patient's labs and radiographic studies in preparation for this case.   Juanell Fairly , MD 10/22/2019, 12:44 PM

## 2019-10-22 NOTE — Op Note (Signed)
10/22/2019  3:57 PM  PATIENT:  Yvonne Knight    PRE-OPERATIVE DIAGNOSIS:  Right comminuted four-part intertrochanteric hip fracture  POST-OPERATIVE DIAGNOSIS:  Same  PROCEDURE: Intramedullary fixation of comminuted four-part intertrochanteric hip fracture  SURGEON:  Thornton Park, MD  ANESTHESIA:   Spinal  EBL: 150 cc  IMPLANT:  ZIMMER BIOMET AFFIXUS NAIL 9 mm x 355m with a 1045mlag screw and distal interlocking screw 46 mm.  PREOPERATIVE INDICATIONS:  Yvonne Knight a  [age].o. female with a diagnosis of right comminuted four-part intertrochanteric hip fracture.  Intramedullary fixation was recommended for this significantly displaced and comminuted fracture.  The risks, benefits and alternatives were discussed with the patient and her son and grandson.  The risks include but are not limited to infection, bleeding requiring blood transfusion, nerve or blood vessel injury, malunion, nonunion, hardware prominence, hardware failure, leg length discrepancy or change in lower extremity rotation and need for further surgery including hardware removal with conversion to a total hip arthroplasty. Medical risks include but are not limited to DVT and pulmonary embolism, myocardial infarction, stroke, pneumonia, respiratory failure and death. The patient and their son and grandson understood these risks and wished to proceed with surgery.  OPERATIVE PROCEDURE:  The patient was met in the preoperative area with her son at the bedside.  Her right hip was marked according the hospital's correct site of surgery protocol.  The patient was brought to the operating room and placed in the supine position on the fracture table. The patient received spinal anesthesia.  A closed reduction was performed under C-arm guidance with a crutch under the proximal femoral shaft..  The fracture reduction was confirmed on both AP and lateral views. After adequate reduction was achieved, a time out was  performed to verify the patient's name, date of birth, medical record number, correct site of surgery correct procedure to be performed. The timeout was also used to verify the patient received antibiotics and all appropriate instruments, implants and radiographic studies were available in the room. Once all in attendance were in agreement, the case began. The patient was prepped and draped in a sterile fashion.  The patient received preoperative antibiotics with 2 g of Ancef IV.  An 2 cm incision was made over the fracture site to improve fracture alignment.  A second 4 cm incision was made proximal to the greater trochanter in line with the femur. A guidewire was placed over the tip of the greater trochanter and advanced by drill into the proximal femur to the level of the lesser trochanter.  An intramedullary reduction tool was used to help pass the guidewire across the fracture.  Confirmation of the drill pin position was made on AP and lateral C-arm images.   The threaded guidepin was then overdrilled with the proximal femoral entry reamer.  A ball-tipped guidewire was then advanced down the intramedullary canal, across the fracture, and down the femoral shaft to the knee.  The ball tip guidewire's position was confirmed at both the knee and hip via C-arm imaging. A depth gauge was used to measure the length of the long nail to be used. It was measured to be 340 mm. The actual nail was then inserted into the proximal femur, across the fracture site and down the femoral shaft without reaming. Its position was confirmed on AP and lateral C-arm images.  The ball tip guidewire was removed.  Once the nail was completely seated, the drill guide for the lag screw was  placed through the guide arm for the Affixus nail. A guidepin was then placed through this drill guide and advanced through the lateral cortex of the femur, across the fracture site and into the femoral head achieving a tip apex distance of less than  25 mm. The length of the drill pin was measured to be 105 mm, and then the drill for the lag screw was advanced through the lateral cortex, across the fracture site and up into the femoral head to the depth of the lag screw.  The lag screw was then advanced by hand into position across the fracture site into the femoral head. Its final position was confirmed on AP and lateral C-arm images. Compression was applied as traction was carefully released. The set screw in the top of the intramedullary rod was tightened by hand using a screwdriver.  The attention was then turned to placement of a distal interlocking screw. A perfect circle technique was used. A small stab incision was made over the distal dynamic interlocking screw hole.  A free hand technique was used to drill the distal interlocking screw hole. The depth of the screw hole was measured with a depth gauge and measured to 46 mm. The 46 mm screw was then advanced into position and tightened by hand. Final C-arm images of the entire intramedullary construct were taken in both the AP and lateral planes.   The wounds were irrigated copiously and closed with 0 Vicryl for closure of the deep fascia and 2-0 Vicryl for subcutaneous closure. The skin was approximated with staples. A dry sterile dressing was applied. I was scrubbed and present the entire case and all sharp, sponge and instrument counts were correct at the conclusion of the case. Patient was transferred to a hospital bed and brought to PACU in stable condition.     Timoteo Gaul, MD

## 2019-10-22 NOTE — Plan of Care (Signed)
  Problem: Education: Goal: Knowledge of General Education information will improve Description: Including pain rating scale, medication(s)/side effects and non-pharmacologic comfort measures Outcome: Progressing   Problem: Clinical Measurements: Goal: Respiratory complications will improve Outcome: Progressing   Problem: Coping: Goal: Level of anxiety will decrease Outcome: Progressing Note: Pt anxiety easily redirected with reassurance   Problem: Pain Managment: Goal: General experience of comfort will improve Outcome: Progressing Note: Pain well controlled this shift with repositioning and prn medications.

## 2019-10-22 NOTE — Progress Notes (Signed)
Initial Nutrition Assessment  DOCUMENTATION CODES:   Not applicable  INTERVENTION:  Will monitor for diet advancement, provide nutrition supplements as appropriate   NUTRITION DIAGNOSIS:   Increased nutrient needs related to post-op healing, hip fracture as evidenced by estimated needs.   GOAL:   Patient will meet greater than or equal to 90% of their needs    MONITOR:   Weight trends, Labs, Diet advancement, Skin, I & O's  REASON FOR ASSESSMENT:   Consult Assessment of nutrition requirement/status  ASSESSMENT:  RD working remotely.  86 year old female admitted for intertrochanteric fracture of right hip after tripping over steps at home and falling backwards per report of her grandson. Past medical history significant of HTN, HLD, depression, dCHF, rheumatoid arthritis, and CKD stage III.  Per patient location, patient is currently in operating room for ORIF of right hip. Unable to obtain nutrition history at this time. Will continue to monitor for diet advancement s/p procedure and provide nutrition supplements as appropriate to aid with meeting increased needs to support post-op wound healing.   Current wt 146.74 lb Limited recent wt history for review, per care everywhere on 08/21/19 she weighed 133.76 lb. Per history on 04/29/19 patient weighed 132.66 lb, and on 11/27/2017 she weighed 146.74 lb. Unsure of accuracy of admit weight.  Medications and labs reviewed   NUTRITION - FOCUSED PHYSICAL EXAM: Unable to complete at this time, RD working remotely.   Diet Order:   Diet Order            Diet NPO time specified Except for: Ice Chips, Sips with Meds  Diet effective now              EDUCATION NEEDS:   No education needs have been identified at this time  Skin:  Skin Assessment: Reviewed RN Assessment  Last BM:  unknown  Height:   Ht Readings from Last 1 Encounters:  10/21/19 5\' 2"  (1.575 m)    Weight:   Wt Readings from Last 1 Encounters:   10/21/19 66.7 kg    BMI:  Body mass index is 26.9 kg/m.  Estimated Nutritional Needs:   Kcal:  1600-1800  Protein:  70-80  Fluid:  1.5 L/day   12/21/19, RD, LDN Clinical Nutrition After Hours/Weekend Pager # in Amion

## 2019-10-22 NOTE — Transfer of Care (Signed)
Immediate Anesthesia Transfer of Care Note  Patient: Yvonne Knight  Procedure(s) Performed: RIGHT INTRAMEDULLARY (IM) NAIL INTERTROCHANTRIC (Right Hip)  Patient Location: PACU  Anesthesia Type:Spinal  Level of Consciousness: drowsy and patient cooperative  Airway & Oxygen Therapy: Patient Spontanous Breathing  Post-op Assessment: Report given to RN and Post -op Vital signs reviewed and stable  Post vital signs: Reviewed and stable  Last Vitals:  Vitals Value Taken Time  BP 112/47 10/22/19 1550  Temp    Pulse 82 10/22/19 1552  Resp 18 10/22/19 1552  SpO2 98 % 10/22/19 1552  Vitals shown include unvalidated device data.  Last Pain:  Vitals:   10/22/19 1159  TempSrc: Temporal  PainSc: 5          Complications: No apparent anesthesia complications

## 2019-10-23 ENCOUNTER — Encounter: Payer: Self-pay | Admitting: Orthopedic Surgery

## 2019-10-23 LAB — BASIC METABOLIC PANEL
Anion gap: 10 (ref 5–15)
BUN: 28 mg/dL — ABNORMAL HIGH (ref 8–23)
CO2: 26 mmol/L (ref 22–32)
Calcium: 7.6 mg/dL — ABNORMAL LOW (ref 8.9–10.3)
Chloride: 106 mmol/L (ref 98–111)
Creatinine, Ser: 1.17 mg/dL — ABNORMAL HIGH (ref 0.44–1.00)
GFR calc Af Amer: 47 mL/min — ABNORMAL LOW (ref >60)
GFR calc non Af Amer: 40 mL/min — ABNORMAL LOW (ref >60)
Glucose, Bld: 137 mg/dL — ABNORMAL HIGH (ref 70–99)
Potassium: 4.1 mmol/L (ref 3.5–5.1)
Sodium: 142 mmol/L (ref 135–145)

## 2019-10-23 LAB — CBC
HCT: 21.5 % — ABNORMAL LOW (ref 36.0–46.0)
HCT: 22.5 % — ABNORMAL LOW (ref 36.0–46.0)
Hemoglobin: 7 g/dL — ABNORMAL LOW (ref 12.0–15.0)
Hemoglobin: 7.7 g/dL — ABNORMAL LOW (ref 12.0–15.0)
MCH: 29.8 pg (ref 26.0–34.0)
MCH: 30.8 pg (ref 26.0–34.0)
MCHC: 32.6 g/dL (ref 30.0–36.0)
MCHC: 34.2 g/dL (ref 30.0–36.0)
MCV: 91.5 fL (ref 80.0–100.0)
MCV: 90 fL (ref 80.0–100.0)
Platelets: 158 10*3/uL (ref 150–400)
Platelets: 126 10*3/uL — ABNORMAL LOW (ref 150–400)
RBC: 2.35 MIL/uL — ABNORMAL LOW (ref 3.87–5.11)
RBC: 2.5 MIL/uL — ABNORMAL LOW (ref 3.87–5.11)
RDW: 17.1 % — ABNORMAL HIGH (ref 11.5–15.5)
RDW: 16.7 % — ABNORMAL HIGH (ref 11.5–15.5)
WBC: 9.2 10*3/uL (ref 4.0–10.5)
WBC: 9 10*3/uL (ref 4.0–10.5)
nRBC: 0 % (ref 0.0–0.2)
nRBC: 0 % (ref 0.0–0.2)

## 2019-10-23 LAB — HEMOGLOBIN AND HEMATOCRIT, BLOOD
HCT: 23.2 % — ABNORMAL LOW (ref 36.0–46.0)
Hemoglobin: 7.9 g/dL — ABNORMAL LOW (ref 12.0–15.0)

## 2019-10-23 LAB — PROTIME-INR
INR: 1.3 — ABNORMAL HIGH (ref 0.8–1.2)
Prothrombin Time: 15.8 seconds — ABNORMAL HIGH (ref 11.4–15.2)

## 2019-10-23 LAB — MAGNESIUM: Magnesium: 1.9 mg/dL (ref 1.7–2.4)

## 2019-10-23 LAB — PREPARE RBC (CROSSMATCH)

## 2019-10-23 LAB — APTT: aPTT: 41 seconds — ABNORMAL HIGH (ref 24–36)

## 2019-10-23 MED ORDER — PHENYLEPHRINE HCL-NACL 10-0.9 MG/250ML-% IV SOLN: 0.0000 ug/min | INTRAVENOUS | Status: DC

## 2019-10-23 MED ORDER — SODIUM CHLORIDE 0.9 % IV SOLN
25.0000 ug/min | INTRAVENOUS | Status: DC
  Administered 2019-10-23: 45 ug/min via INTRAVENOUS
  Filled 2019-10-23: qty 1
  Filled 2019-10-23: qty 10

## 2019-10-23 MED ORDER — ENSURE ENLIVE PO LIQD
237.0000 mL | Freq: Two times a day (BID) | ORAL | Status: DC
  Administered 2019-10-24 – 2019-10-28 (×9): 237 mL via ORAL

## 2019-10-23 MED ORDER — SODIUM CHLORIDE 0.9% IV SOLUTION: Freq: Once | INTRAVENOUS | Status: AC

## 2019-10-23 MED ORDER — PHENYLEPHRINE HCL-NACL 10-0.9 MG/250ML-% IV SOLN
25.0000 ug/min | INTRAVENOUS | Status: DC
  Administered 2019-10-23: 45 ug/min via INTRAVENOUS
  Filled 2019-10-23: qty 250

## 2019-10-23 NOTE — Evaluation (Signed)
Physical Therapy Evaluation Patient Details Name: Yvonne Knight MRN: [id number] DOB: [birth year] Today's Date: 10/23/2019   History of Present Illness  86 y.o. female with medical history significant for hypertension, hyperlipidemia, depression, dCHF, rheumatoid arthritis, CKD stage III, who presented with mechanical fall, right hip pain. Imaging showed right comminuted four-part intertrochanteric hip fracture with significant displacement. Pt is now S/p R IM fixation. Pt to be NWB in RLE. Pt sent to stepdown postoperatively due to hypotension in the PACU.  Clinical Impression  Pt with low hemoglobin (7.0) and needing blood transfusion, AM session deferred. Seen for PM session shortly after completion of transfusion.  Pt pleasant and eager to work with PT, but was pain limited and unable to tolerate standing/success at stepping/hopping/unweighting, though she did well with maintaining NWBing on the R.  Pt needed considerable assist with mobility and maintenance of upright posture.  She showed good effort with exercises, but needed a lot of extra time, encouragement and cuing as she c/o much pain with essentially all R LE movement.    Follow Up Recommendations SNF    Equipment Recommendations   (TBD at next venue of care)    Recommendations for Other Services       Precautions / Restrictions Precautions Precautions: Fall Precaution Comments: High Fall Restrictions Weight Bearing Restrictions: Yes RLE Weight Bearing: Non weight bearing      Mobility  Bed Mobility Overal bed mobility: Needs Assistance Bed Mobility: Sit to Supine;Supine to Sit     Supine to sit: Max assist;HOB elevated Sit to supine: Total assist   General bed mobility comments: Pt was unable to show a lot of effort in rolling, transitioning to sitting.  Pain and weakness limited  Transfers Overall transfer level: Needs assistance Equipment used: Rolling walker (2 wheeled) Transfers: Sit to/from Stand Sit  to Stand: +2 physical assistance;Max assist;Mod assist         General transfer comment: Pt did well with maintance of R NWBing, however even with +2 assist and heavy cuing she maintained leaning back of legs on EOB and could not shift weight effectively over the walker (used youth walker to maximize her ability to do so.)  Ambulation/Gait             General Gait Details: multiple attempts (including with significant unweighting assist) at small unweighting/heel-toe effort on the L.  Pt unable to get weight over walker/off back of leg on bed enough to succeed  Stairs            Wheelchair Mobility    Modified Rankin (Stroke Patients Only)       Balance Overall balance assessment: Needs assistance Sitting-balance support: Bilateral upper extremity supported Sitting balance-Leahy Scale: Fair Sitting balance - Comments: Steady static sitting at EOB   Standing balance support: Bilateral upper extremity supported (+2 assist, able to maintain R NWBing) Standing balance-Leahy Scale: Zero Standing balance comment: Heavy posterior lean. Pt unable to advance LLE with bue support on RW.                             Pertinent Vitals/Pain Pain Assessment: Faces Pain Score: 10-Worst pain ever Faces Pain Scale: Hurts even more Pain Location: Pt reports 10/10 pain with movement of RLE, however smiling and conversational t/o the session with no severe pain responses, etc Pain Descriptors / Indicators: Grimacing;Guarding Pain Intervention(s): Limited activity within patient's tolerance;Monitored during session;Repositioned;Premedicated before session    Home Living Family/patient expects  to be discharged to:: Skilled nursing facility Living Arrangements: Alone               Additional Comments: Pt lives alone, has PCAs ~5 hrs/day split between AM/PM.    Prior Function Level of Independence: Independent with assistive device(s)         Comments: Pt reports  she is generally able to perform ADL management independently. PCA's assist driving, etc.     Hand Dominance   Dominant Hand: Right    Extremity/Trunk Assessment   Upper Extremity Assessment Upper Extremity Assessment: Generalized weakness (age appropriate limitations, baseline arthritic limitations)    Lower Extremity Assessment Lower Extremity Assessment: Generalized weakness RLE Deficits / Details: very limited AROM in R hip and knee, c/o pain with most acts       Communication   Communication: HOH  Cognition Arousal/Alertness: Awake/alert Behavior During Therapy: WFL for tasks assessed/performed Overall Cognitive Status: Within Functional Limits for tasks assessed                                 General Comments: Pleasant & conversational, showed good general awareness but did repeat herself . No family present to determine baseline.      General Comments General comments (skin integrity, edema, etc.): Pt LUE noteably bruised/swollen across hand, forearm, and elbow.    Exercises General Exercises - Lower Extremity Ankle Circles/Pumps: AROM;10 reps Quad Sets: Strengthening;10 reps Short Arc Quad: AAROM;5 reps Hip ABduction/ADduction: AAROM;10 reps Other Exercises Other Exercises: Pt educated on falls prevention strategies, safe use of AE/DME for ADL, and role of OT in acute seting.   Assessment/Plan    PT Assessment Patient needs continued PT services  PT Problem List Decreased strength;Decreased range of motion;Decreased activity tolerance;Decreased mobility;Decreased balance;Decreased coordination;Decreased knowledge of use of DME;Decreased safety awareness;Pain;Decreased knowledge of precautions       PT Treatment Interventions DME instruction;Gait training;Stair training;Functional mobility training;Therapeutic activities;Therapeutic exercise;Balance training;Neuromuscular re-education;Patient/family education    PT Goals (Current goals can be  found in the Care Plan section)  Acute Rehab PT Goals Patient Stated Goal: To go home when I can PT Goal Formulation: With patient Time For Goal Achievement: 11/06/19 Potential to Achieve Goals: Good    Frequency BID   Barriers to discharge        Co-evaluation PT/OT/SLP Co-Evaluation/Treatment: Yes Reason for Co-Treatment: For patient/therapist safety;To address functional/ADL transfers PT goals addressed during session: Mobility/safety with mobility;Balance;Proper use of DME;Strengthening/ROM OT goals addressed during session: ADL's and self-care;Proper use of Adaptive equipment and DME       AM-PAC PT "6 Clicks" Mobility  Outcome Measure Help needed turning from your back to your side while in a flat bed without using bedrails?: A Lot Help needed moving from lying on your back to sitting on the side of a flat bed without using bedrails?: Total Help needed moving to and from a bed to a chair (including a wheelchair)?: Total Help needed standing up from a chair using your arms (e.g., wheelchair or bedside chair)?: Total Help needed to walk in hospital room?: Total Help needed climbing 3-5 steps with a railing? : Total 6 Click Score: 7    End of Session Equipment Utilized During Treatment: Gait belt Activity Tolerance: Patient limited by pain Patient left: with call bell/phone within reach;in bed (OT in room) Nurse Communication: Mobility status PT Visit Diagnosis: Muscle weakness (generalized) (M62.81);Difficulty in walking, not elsewhere classified (R26.2);Pain Pain - Right/Left:  Right Pain - part of body: Hip    Time: 1355-1430 PT Time Calculation (min) (ACUTE ONLY): 35 min   Charges:   PT Evaluation $PT Eval Low Complexity: 1 Low PT Treatments $Therapeutic Exercise: 8-22 mins        Malachi Pro, DPT 10/23/2019, 5:06 PM

## 2019-10-23 NOTE — Evaluation (Signed)
Occupational Therapy Evaluation Patient Details Name: Yvonne Knight MRN: [id number] DOB: 13-Jul-1925 Today's Date: 10/23/2019    History of Present Illness Yvonne Knight is a 86 y.o. female with medical history significant for hypertension, hyperlipidemia, depression, dCHF, rheumatoid arthritis, CKD stage III, who presented with mechanical fall, right hip pain. Imaging showed right comminuted four-part intertrochanteric hip fracture with significant displacement. Pt is now S/p R IM fixation. Pt to be NWB in RLE. Pt sent to stepdown postoperatively due to hypotension in the PACU.   Clinical Impression   Yvonne Knight was seen for OT/PT co-evaluation this date, POD #1 from above surgery. Prior to hospital admission, pt was generally independent with BADL management. She reports that she lives alone in a 1 level home with PCAs who visit her in the morning and evening for ~ 5 hours total/day. Currently pt demonstrates impairments as described below (See OT problem list) which functionally limit her ability to perform ADL/self-care tasks. Pt currently requires +2 MAX A for functional mobility as well as MOD A for LB ADL management in a seated position due to NWB status, pain, and decreased AROM of her RLE.  Pt would benefit from skilled OT to address noted impairments and functional limitations (see below for any additional details) in order to maximize safety and independence while minimizing falls risk and caregiver burden. Upon hospital discharge, recommend STR to maximize pt safety and return to PLOF.      Follow Up Recommendations  SNF;Supervision - Intermittent    Equipment Recommendations  3 in 1 bedside commode    Recommendations for Other Services       Precautions / Restrictions Precautions Precautions: Fall Precaution Comments: High Fall Restrictions Weight Bearing Restrictions: Yes RLE Weight Bearing: Non weight bearing      Mobility Bed Mobility Overal bed mobility: Needs  Assistance Bed Mobility: Sit to Supine;Supine to Sit     Supine to sit: Max assist;HOB elevated Sit to supine: Total assist      Transfers Overall transfer level: Needs assistance   Transfers: Sit to/from Stand Sit to Stand: +2 physical assistance;Max assist;Mod assist         General transfer comment: Stands with RW and +2 support from therapists. Maintains NWB status with min cueing.    Balance Overall balance assessment: Needs assistance Sitting-balance support: Feet unsupported;Bilateral upper extremity supported Sitting balance-Leahy Scale: Fair Sitting balance - Comments: Steady static sitting at EOB   Standing balance support: During functional activity;Bilateral upper extremity supported (Maintains NWB status) Standing balance-Leahy Scale: Poor Standing balance comment: Heavy posterior lean. Pt unable to advance LLE with bue support on RW.                           ADL either performed or assessed with clinical judgement   ADL                                         General ADL Comments: Pt is functionally limited by generalized weakness, increased pain and decreased functional use of her RLE. She requires MAX A for bed mobility as well as MAX A +2 to stand using RW and maintaining NWB status. MOD A For LB ADL in seated position.     Vision Baseline Vision/History: Wears glasses;Cataracts (Hx bilat cataract sx.) Wears Glasses: Reading only Patient Visual Report: No change from baseline  Perception     Praxis      Pertinent Vitals/Pain Pain Assessment: 0-10 Pain Score: 10-Worst pain ever Pain Location: Pt reports 10/10 pain with movement of RLE Pain Descriptors / Indicators: Grimacing;Guarding Pain Intervention(s): Limited activity within patient's tolerance;Monitored during session;Repositioned;Premedicated before session     Hand Dominance Right   Extremity/Trunk Assessment Upper Extremity Assessment Upper  Extremity Assessment: Generalized weakness (Hx of BUE arthritis. Pt noted to have decreased [place] with fingers generally remaining flexed to 90 at MCPs 2nd-5th digits.)   Lower Extremity Assessment Lower Extremity Assessment: Generalized weakness;RLE deficits/detail;Defer to PT evaluation RLE Deficits / Details: s/p R IM fixation, with pt to maintain NWB status during ADL management.       Communication Communication Communication: HOH (Per chart, hears better out of L ear)   Cognition Arousal/Alertness: Awake/alert Behavior During Therapy: WFL for tasks assessed/performed Overall Cognitive Status: Within Functional Limits for tasks assessed                                 General Comments: Pleasant & conversational, requires occasional repetition of questions asked. Suspect due to [facility]. No family present to determine baseline.   General Comments  Pt LUE noteably bruised/swollen across hand, forearm, and elbow.    Exercises Other Exercises Other Exercises: Pt educated on falls prevention strategies, safe use of AE/DME for ADL, and role of OT in acute seting.   Shoulder Instructions      Home Living Family/patient expects to be discharged to:: Skilled nursing facility Living Arrangements: Alone                               Additional Comments: Pt lives alone, has PCAs ~5 hrs/day split between AM/PM.      Prior Functioning/Environment Level of Independence: Independent with assistive device(s)        Comments: Pt reports she is generally able to perform ADL management independently. PCA's assist with IADL tasks including "writing for me", driving, etc.        OT Problem List: Decreased strength;Decreased coordination;Pain;Impaired balance (sitting and/or standing);Decreased safety awareness;Decreased activity tolerance;Decreased knowledge of use of DME or AE;Decreased knowledge of precautions      OT Treatment/Interventions: Self-care/ADL  training;Therapeutic exercise;Therapeutic activities;DME and/or AE instruction;Patient/family education;Balance training    OT Goals(Current goals can be found in the care plan section) Acute Rehab OT Goals Patient Stated Goal: To go home when I can OT Goal Formulation: With patient Time For Goal Achievement: 11/06/19 Potential to Achieve Goals: Good ADL Goals Pt Will Perform Lower Body Dressing: sit to/from stand;with min guard assist;with min assist;with adaptive equipment (c LRAD PRN for improved safety and fxl independence upon hospital DC) Pt Will Transfer to Toilet: with min assist;stand pivot transfer;bedside commode (c LRAD PRN for improved safety and fxl independence upon hospital DC) Pt Will Perform Toileting - Clothing Manipulation and hygiene: sit to/from stand;with mod assist;with adaptive equipment (c LRAD PRN for improved safety and fxl independence upon hospital DC) Additional ADL Goal #1: Pt will independently verbalize a plan to implement at least 3 learned falls prevention strategies into her daily routines/home environment for improved safety and fxl independence upon hospital DC.  OT Frequency: Min 1X/week   Barriers to D/C: Decreased caregiver support;Inaccessible home environment          Co-evaluation PT/OT/SLP Co-Evaluation/Treatment: Yes Reason for Co-Treatment: For patient/therapist safety;To address  functional/ADL transfers PT goals addressed during session: Mobility/safety with mobility;Balance;Proper use of DME;Strengthening/ROM OT goals addressed during session: ADL's and self-care;Proper use of Adaptive equipment and DME      AM-PAC OT "6 Clicks" Daily Activity     Outcome Measure Help from another person eating meals?: None Help from another person taking care of personal grooming?: A Little Help from another person toileting, which includes using toliet, bedpan, or urinal?: A Lot Help from another person bathing (including washing, rinsing, drying)?: A  Lot Help from another person to put on and taking off regular upper body clothing?: A Little Help from another person to put on and taking off regular lower body clothing?: A Lot 6 Click Score: 16   End of Session Equipment Utilized During Treatment: Gait belt;Rolling walker Nurse Communication: Mobility status  Activity Tolerance: Patient tolerated treatment well Patient left: in bed;with call bell/phone within reach;with bed alarm set;with nursing/sitter in room  OT Visit Diagnosis: Other abnormalities of gait and mobility (R26.89);Muscle weakness (generalized) (M62.81);Pain Pain - Right/Left: Right Pain - part of body: Leg                Time: 1422-1446 OT Time Calculation (min): 24 min Charges:  OT General Charges $OT Visit: 1 Visit OT Evaluation $OT Eval Moderate Complexity: 1 Mod OT Treatments $Self Care/Home Management : 8-22 mins  Rockney Ghee, M.S., OTR/L Ascom: [phone] 10/23/19, 4:30 PM

## 2019-10-23 NOTE — TOC Initial Note (Signed)
Transition of Care [facility]) - Initial/Assessment Note    Patient Details  Name: Yvonne Knight MRN: [id number] Date of Birth: [birth year]  [facility]) CM/SW Contact:    Magnus Ivan, LCSW Phone Number: 10/23/2019, 2:10 PM  Clinical Narrative:            CSW met with patient at bedside for Readmission Screen. Patient reported she lives at home alone, but has a neighbor Karleen Hampshire) who helps her and stays with her most of the time. Patient reported her son lives in [place] and is also involved and supportive with her care. PCP is Dr. Ginette Pitman. [facility] [place] and patient denied issues with obtaining medications. DME at home includes a walker, wheelchair, rollator, cane, and BSC. Patient denied Home Health history. Patient reported she went to [facility] in [place] in the past and she and her son would like her to go back here at discharge if possible. Waiting on PT/OT evals and recommendations.   Expected Discharge Plan: [place] Barriers to Discharge: Continued Medical Work up   Patient Goals and CMS Choice Patient states their goals for this hospitalization and ongoing recovery are:: to go to SNF- wants [facility].gov Compare Post Acute Care list provided to:: Patient Choice offered to / list presented to : Patient  Expected Discharge Plan and Services Expected Discharge Plan: [place] Choice: [place] arrangements for the past 2 months: Single Family Home                                      Prior Living Arrangements/Services Living arrangements for the past 2 months: Single Family Home Lives with:: Self (Says that a neighbor stays with her) Patient language and need for interpreter reviewed:: Yes        Need for Family Participation in Patient Care: Yes (Comment) Care giver support system in place?: Yes (comment) Current home services:  DME Criminal Activity/Legal Involvement Pertinent to Current Situation/Hospitalization: No - Comment as needed  Activities of Daily Living Home Assistive Devices/Equipment: None ADL Screening (condition at time of admission) Patient's cognitive ability adequate to safely complete daily activities?: No Is the patient deaf or have difficulty hearing?: Yes Does the patient have difficulty seeing, even when wearing glasses/contacts?: No Does the patient have difficulty concentrating, remembering, or making decisions?: No Patient able to express need for assistance with ADLs?: Yes Does the patient have difficulty dressing or bathing?: Yes Independently performs ADLs?: No Communication: Independent Dressing (OT): Needs assistance Is this a change from baseline?: Pre-admission baseline Grooming: Needs assistance Is this a change from baseline?: Pre-admission baseline Feeding: Independent Bathing: Needs assistance Is this a change from baseline?: Pre-admission baseline Toileting: Needs assistance Is this a change from baseline?: Change from baseline, expected to last <3 days In/Out Bed: Needs assistance Is this a change from baseline?: Change from baseline, expected to last >3 days Walks in Home: Independent Does the patient have difficulty walking or climbing stairs?: No Weakness of Legs: Right Weakness of Arms/Hands: None  Permission Sought/Granted Permission sought to share information with : Facility Sport and exercise psychologist, Family Supports Permission granted to share information with : Yes, Verbal Permission Granted     Permission granted to share info w AGENCY: SNFs  Permission granted to share info w Relationship: son, neighbor Karleen Hampshire)  Emotional Assessment Appearance:: Appears stated age Attitude/Demeanor/Rapport: Gracious, Engaged Affect (typically observed): Calm, Happy Orientation: : Oriented to Situation, Oriented to  Time, Oriented to Place, Oriented to  Self Alcohol / Substance Use: Not Applicable Psych Involvement: No (comment)  Admission diagnosis:  Intertrochanteric fracture of right hip ([place]) [S72.141A] Fall, initial encounter [W19.XXXA] Closed right hip fracture, initial encounter [facility]) [S72.001A] Patient Active Problem List   Diagnosis Date Noted  . Chronic diastolic CHF (congestive heart failure) ([place]) 10/21/2019  . Depression 10/21/2019  . Acute renal failure superimposed on stage 3a chronic kidney disease ([place]) 10/21/2019  . Fall at home, initial encounter 10/21/2019  . Intertrochanteric fracture of right hip ([place]) 10/21/2019  . Rheumatoid arthritis ([place]) 10/21/2019  . Humerus fracture 02/05/2016  . Pubic ramus fracture ([place]) 02/05/2016  . Ulnar fracture 02/05/2016  . HTN (hypertension) 02/05/2016  . Fall with injury 02/05/2016   PCP:  Tracie Harrier, MD Pharmacy:   [facility] [phone] - [address], [address] [place] [address] Phone: [phone] Fax: [phone]  [id number] [place] [place] [id number]: [phone]: [phone]    Social Determinants of Health (SDOH) Interventions    Readmission Risk Interventions Readmission Risk Prevention Plan 10/23/2019  Transportation Screening Complete  PCP or Specialist Appt within 3-5 Days Complete  HRI or Home Care Consult Complete  Medication Review (RN Care Manager) Complete  Some recent data might be hidden

## 2019-10-23 NOTE — Progress Notes (Signed)
PROGRESS NOTE    Yvonne Knight  GXQ:[id number] DOB: [birth year] DOA: 10/21/2019 PCP: Barbette Reichmann, MD    Assessment & Plan:   Principal Problem:   Intertrochanteric fracture of right hip (HCC) Active Problems:   HTN (hypertension)   Chronic diastolic CHF (congestive heart failure) (HCC)   Depression   Acute renal failure superimposed on stage 3a chronic kidney disease (HCC)   Fall at home, initial encounter   Rheumatoid arthritis (HCC)    Yvonne Knight is a 86 y.o. Caucasian female with medical history significant of hypertension, hyperlipidemia, depression, dCHF, rheumatoid arthritis, CKD stage III, who presented with mechanical fall, right hip pain.  Intertrochanteric fracture of right hip s/p RIGHT INTRAMEDULLARY (IM) NAIL INTERTROCHANTRIC on 10/23/19 X-ray of pelvis showed comminuted right intertrochanteric to subtrochanteric fracture with lateral angulation. No neurovascular compromise. Orthopedic surgeon, Dr. Latorsha Clan performed the surgery. PLAN: - Pain control: percocet PRN  - Robaxin for muscle spasm --PT/OT   Post-op hypotension likely due to anesthesia -blood loss ~150 ml.  Received about 2.5L IVF.  Neo 100 mcg x3 in the PACU.  BP 90's. --due to age, multiple comorbidities, pt was transferred to stepdown for close monitoring and low-dose pressor PLAN: --continue MIVF@75  --Neo gtt titratable per nursing to maintain systolic >100 (ok for diastolic to be low) --Hold all BP meds.  Chronic diastolic CHF (congestive heart failure) (HCC): 2D echo on 07/30/2018 showed EF >55%.  Patient has a trace leg edema, but no JVD.  No shortness of breath.  Chest x-ray has no pulmonary edema.  BNP is elevated 397.  Patient does not have CHF exacerbation -Hold Lasix due to worsening renal function and hypotension  Depression: -Celexa, olanzapine  Acute renal failure superimposed on stage 3a chronic kidney disease (HCC): Baseline Cre is 0.8 on 07/23/19, pt's Cre is  1.27  and BUN  23 on admission. Likely due to dehydration and continuation of ARB, diuretics --continue MIVF@75  - Hold Lasix and Cozaar  Fall at home, initial encounter: -pt/ot  Rheumatoid arthritis: -Continue methotrexate   DVT prophylaxis: Lovenox SQ Code Status: Full code  Family Communication:  Status is: inpatient Dispo:   The patient is from: home Anticipated d/c is to: SNF rehab Anticipated d/c date is: 2-3 days Patient currently is not medically stable to d/c due to: hypotension just post-op, still on neo gtt.   Subjective and Interval History:  Pt reported doing better, slept well, already had PT session, ate, and starting to make more urine.  No fever, dyspnea, chest pain, abdominal pain, N/V/D.  Pain well controlled.   Objective: Vitals:   10/23/19 1500 10/23/19 1530 10/23/19 1600 10/23/19 1630  BP: (!) 107/22 (!) 128/36 (!) 131/44 (!) 151/45  Pulse: 80 76 73 81  Resp: 19 (!) 21 (!) 21 20  Temp:   97.9 F (36.6 C)   TempSrc:   Oral   SpO2: 96% 94% 98% 94%  Weight:      Height:        Intake/Output Summary (Last 24 hours) at 10/23/2019 1655 Last data filed at 10/23/2019 1614 Gross per 24 hour  Intake 4404.52 ml  Output 440 ml  Net 3964.52 ml   Filed Weights   10/21/19 1248  Weight: 66.7 kg    Examination:   Constitutional: NAD, AAOx3, calm, very talkative! HEENT: conjunctivae and lids normal, EOMI CV: RRR 2+ systolic murmur at upper sternal boarder. Distal pulses +2.  No cyanosis.   RESP: CTA B/L, normal respiratory effort, on RA  GI: +BS, NTND Extremities: No effusions, edema, or tenderness in BLE SKIN: warm, dry and intact Neuro: II - XII grossly intact.  Sensation intact   Data Reviewed: I have personally reviewed following labs and imaging studies  CBC: Recent Labs  Lab 10/21/19 1250 10/22/19 0429 10/23/19 0429 10/23/19 1541  WBC 8.0 7.9 9.2  --   NEUTROABS 4.6  --   --   --   HGB 12.7 10.5* 7.0* 7.9*  HCT 38.9 32.3* 21.5*  23.2*  MCV 91.1 91.8 91.5  --   PLT 174 162 158  --    Basic Metabolic Panel: Recent Labs  Lab 10/21/19 1250 10/22/19 0429 10/23/19 0429  NA 142 143 142  K 3.8 3.9 4.1  CL 104 107 106  CO2 30 31 26   GLUCOSE 125* 175* 137*  BUN 23 26* 28*  CREATININE 1.27* 1.14* 1.17*  CALCIUM 8.7* 8.3* 7.6*  MG  --   --  1.9   GFR: Estimated Creatinine Clearance: 27.5 mL/min (A) (by C-G formula based on SCr of 1.17 mg/dL (H)). Liver Function Tests: Recent Labs  Lab 10/21/19 1250  AST 20  ALT 15  ALKPHOS 54  BILITOT 0.6  PROT 6.3*  ALBUMIN 3.7   No results for input(s): LIPASE, AMYLASE in the last 168 hours. No results for input(s): AMMONIA in the last 168 hours. Coagulation Profile: Recent Labs  Lab 10/21/19 2020  INR 1.1   Cardiac Enzymes: No results for input(s): CKTOTAL, CKMB, CKMBINDEX, TROPONINI in the last 168 hours. BNP (last 3 results) No results for input(s): PROBNP in the last 8760 hours. HbA1C: No results for input(s): HGBA1C in the last 72 hours. CBG: No results for input(s): GLUCAP in the last 168 hours. Lipid Profile: No results for input(s): CHOL, HDL, LDLCALC, TRIG, CHOLHDL, LDLDIRECT in the last 72 hours. Thyroid Function Tests: No results for input(s): TSH, T4TOTAL, FREET4, T3FREE, THYROIDAB in the last 72 hours. Anemia Panel: No results for input(s): VITAMINB12, FOLATE, FERRITIN, TIBC, IRON, RETICCTPCT in the last 72 hours. Sepsis Labs: No results for input(s): PROCALCITON, LATICACIDVEN in the last 168 hours.  Recent Results (from the past 240 hour(s))  SARS Coronavirus 2 by RT PCR (hospital order, performed in [facility] hospital lab) Nasopharyngeal Nasopharyngeal Swab     Status: None   Collection Time: 10/21/19  2:09 PM   Specimen: Nasopharyngeal Swab  Result Value Ref Range Status   SARS Coronavirus 2 NEGATIVE NEGATIVE Final    Comment: (NOTE) SARS-CoV-2 target nucleic acids are NOT DETECTED. The SARS-CoV-2 RNA is generally detectable in upper  and lower respiratory specimens during the acute phase of infection. The lowest concentration of SARS-CoV-2 viral copies this assay can detect is 250 copies / mL. A negative result does not preclude SARS-CoV-2 infection and should not be used as the sole basis for treatment or other patient management decisions.  A negative result may occur with improper specimen collection / handling, submission of specimen other than nasopharyngeal swab, presence of viral mutation(s) within the areas targeted by this assay, and inadequate number of viral copies (<250 copies / mL). A negative result must be combined with clinical observations, patient history, and epidemiological information. Fact Sheet for Patients:   12/21/19 Fact Sheet for Healthcare Providers: BoilerBrush.com.cy This test is not yet approved or cleared  by the https://pope.com/ FDA and has been authorized for detection and/or diagnosis of SARS-CoV-2 by FDA under an Emergency Use Authorization (EUA).  This EUA will remain in effect (meaning this  test can be used) for the duration of the COVID-19 declaration under Section 564(b)(1) of the Act, 21 U.S.C. section 360bbb-3(b)(1), unless the authorization is terminated or revoked sooner. Performed at [facility], [address] [place], [place] [address]   MRSA PCR Screening     Status: None   Collection Time: 10/22/19  8:22 PM   Specimen: Nasopharyngeal  Result Value Ref Range Status   MRSA by PCR NEGATIVE NEGATIVE Final    Comment:        The GeneXpert MRSA Assay (FDA approved for NASAL specimens only), is one component of a comprehensive MRSA colonization surveillance program. It is not intended to diagnose MRSA infection nor to guide or monitor treatment for MRSA infections. Performed at [facility], [address], [place], [place] [address]       Radiology Studies: CT HEAD WO  CONTRAST  Result Date: 10/21/2019 CLINICAL DATA:  Fall EXAM: CT HEAD WITHOUT CONTRAST TECHNIQUE: Contiguous axial images were obtained from the base of the skull through the vertex without intravenous contrast. COMPARISON:  None. FINDINGS: Brain: There is no mass, hemorrhage or extra-axial collection. The size and configuration of the ventricles and extra-axial CSF spaces are normal. There is hypoattenuation of the white matter, most commonly indicating chronic small vessel disease. Vascular: Atherosclerotic calcification of the vertebral and internal carotid arteries at the skull base. No abnormal hyperdensity of the major intracranial arteries or dural venous sinuses. Skull: The visualized skull base, calvarium and extracranial soft tissues are normal. Sinuses/Orbits: No fluid levels or advanced mucosal thickening of the visualized paranasal sinuses. No mastoid or middle ear effusion. The orbits are normal. IMPRESSION: Chronic small vessel disease without acute intracranial abnormality. Electronically Signed   By: Deatra Robinson M.D.   On: 10/21/2019 19:41   DG HIP OPERATIVE UNILAT W OR W/O PELVIS RIGHT  Result Date: 10/22/2019 CLINICAL DATA:  Right hip fracture. EXAM: OPERATIVE right HIP (WITH PELVIS IF PERFORMED) 4 VIEWS, POSTOPERATIVE VIEWS OF THE RIGHT HIP. TECHNIQUE: Fluoroscopic spot image(s) were submitted for interpretation post-operatively. COMPARISON:  Radiographs 10/21/2019 FINDINGS: Fluoroscopic intraoperative spot films and postoperative radiographs demonstrate a long intramedullary gamma nail in the femur with a proximal dynamic hip screw and a single distal interlocking screw. The dynamic hip screw appears to be in the fracture. There is approximately slightly more than 1 cortex width of medial displacement. IMPRESSION: Internal fixation of comminuted intertrochanteric fracture. Electronically Signed   By: Rudie Meyer M.D.   On: 10/22/2019 17:25   DG FEMUR, MIN 2 VIEWS RIGHT  Result Date:  10/22/2019 CLINICAL DATA:  Right hip fracture. EXAM: OPERATIVE right HIP (WITH PELVIS IF PERFORMED) 4 VIEWS, POSTOPERATIVE VIEWS OF THE RIGHT HIP. TECHNIQUE: Fluoroscopic spot image(s) were submitted for interpretation post-operatively. COMPARISON:  Radiographs 10/21/2019 FINDINGS: Fluoroscopic intraoperative spot films and postoperative radiographs demonstrate a long intramedullary gamma nail in the femur with a proximal dynamic hip screw and a single distal interlocking screw. The dynamic hip screw appears to be in the fracture. There is approximately slightly more than 1 cortex width of medial displacement. IMPRESSION: Internal fixation of comminuted intertrochanteric fracture. Electronically Signed   By: Rudie Meyer M.D.   On: 10/22/2019 17:25     Scheduled Meds: . calcium-vitamin D  1 tablet Oral Daily  . Chlorhexidine Gluconate Cloth  6 each Topical Daily  . cholecalciferol  1,000 Units Oral Daily  . citalopram  20 mg Oral Daily  . docusate sodium  100 mg Oral BID  . enoxaparin (  LOVENOX) injection  30 mg Subcutaneous Q24H  . feeding supplement (ENSURE ENLIVE)  237 mL Oral BID BM  . folic acid  1 mg Oral Daily  . [START ON 10/24/2019] methotrexate  10 mg Oral Weekly  . multivitamin with minerals  1 tablet Oral Daily  . OLANZapine  2.5 mg Oral QHS  . senna  1 tablet Oral BID  . vitamin B-12  1,000 mcg Oral Daily   Continuous Infusions: . sodium chloride 75 mL/hr at 10/23/19 1614  . phenylephrine (NEO-SYNEPHRINE) Adult infusion 48 mcg/min (10/23/19 1614)     LOS: 2 days     Enzo Bi, MD [facility] If 7PM-7AM, please contact night-coverage 10/23/2019, 4:55 PM

## 2019-10-23 NOTE — Anesthesia Postprocedure Evaluation (Signed)
Anesthesia Post Note  Patient: Yvonne Knight  Procedure(s) Performed: RIGHT INTRAMEDULLARY (IM) NAIL INTERTROCHANTRIC (Right Hip)  Patient location during evaluation: Nursing Unit Anesthesia Type: Spinal Level of consciousness: awake and alert and awake Pain management: pain level controlled Vital Signs Assessment: post-procedure vital signs reviewed and stable Anesthetic complications: no   No complications documented.   Last Vitals:  Vitals:   10/23/19 0700 10/23/19 0800  BP: (!) 134/43 (!) 143/45  Pulse: 69 69  Resp: 14 18  Temp:  37.1 C  SpO2: 94% 98%    Last Pain:  Vitals:   10/23/19 0800  TempSrc: Oral  PainSc: 0-No pain    LLE Motor Response: (P) Purposeful movement;Responds to commands (10/23/19 0800) LLE Sensation: (P) No pain;No numbness;No tingling (10/23/19 0800) RLE Motor Response: (P) Purposeful movement;Responds to commands (10/23/19 0800) RLE Sensation: (P) No pain;No numbness;No tingling (10/23/19 0800)      Jaye Beagle

## 2019-10-23 NOTE — Progress Notes (Signed)
Pt resting in bed quietly with no s/s of distress. Pt is alert and oriented. Grandson who is a physician is at the bedside. Was able to titrate neo gtt to throughout shift. Incision sites on right side look good with no drainage. VSS on room air.

## 2019-10-23 NOTE — Progress Notes (Signed)
Subjective:  POD #1 s/p intramedullary fixation for comminuted right four-part intertrochanteric hip fracture.   Patient reports right hip pain as mild.  Patient denies any chest pain, shortness of breath or abdominal pain.  Objective:   VITALS:   Vitals:   10/23/19 1100 10/23/19 1102 10/23/19 1130 10/23/19 1200  BP:  (!) 129/48 (!) 145/43 (!) 147/54  Pulse: 71 70 65 75  Resp: 17 20 16  (!) 21  Temp:  97.8 F (36.6 C)    TempSrc:  Oral    SpO2: 96% 95% 97% 95%  Weight:      Height:        PHYSICAL EXAM: Right lower extremity: No significant swelling. Neurovascular intact Sensation intact distally Intact pulses distally Dorsiflexion/Plantar flexion intact Right hip dressing: moderate drainage No cellulitis present Right thigh compartments are soft and compressible  LABS  Results for orders placed or performed during the hospital encounter of 10/21/19 (from the past 24 hour(s))  MRSA PCR Screening     Status: None   Collection Time: 10/22/19  8:22 PM   Specimen: Nasopharyngeal  Result Value Ref Range   MRSA by PCR NEGATIVE NEGATIVE  Basic metabolic panel     Status: Abnormal   Collection Time: 10/23/19  4:29 AM  Result Value Ref Range   Sodium 142 135 - 145 mmol/L   Potassium 4.1 3.5 - 5.1 mmol/L   Chloride 106 98 - 111 mmol/L   CO2 26 22 - 32 mmol/L   Glucose, Bld 137 (H) 70 - 99 mg/dL   BUN 28 (H) 8 - 23 mg/dL   Creatinine, Ser 12/23/19 (H) 0.44 - 1.00 mg/dL   Calcium 7.6 (L) 8.9 - 10.3 mg/dL   GFR calc non Af Amer 40 (L) >60 mL/min   GFR calc Af Amer 47 (L) >60 mL/min   Anion gap 10 5 - 15  CBC     Status: Abnormal   Collection Time: 10/23/19  4:29 AM  Result Value Ref Range   WBC 9.2 4.0 - 10.5 K/uL   RBC 2.35 (L) 3.87 - 5.11 MIL/uL   Hemoglobin 7.0 (L) 12.0 - 15.0 g/dL   HCT 12/23/19 (L) 36 - 46 %   MCV 91.5 80.0 - 100.0 fL   MCH 29.8 26.0 - 34.0 pg   MCHC 32.6 30.0 - 36.0 g/dL   RDW 93.5 (H) 70.1 - 77.9 %   Platelets 158 150 - 400 K/uL   nRBC 0.0 0.0 - 0.2  %  Magnesium     Status: None   Collection Time: 10/23/19  4:29 AM  Result Value Ref Range   Magnesium 1.9 1.7 - 2.4 mg/dL  Prepare RBC (crossmatch)     Status: None   Collection Time: 10/23/19  8:30 AM  Result Value Ref Range   Order Confirmation      ORDER PROCESSED BY BLOOD BANK Performed at [facility], [address], [place], [place]     DG Chest 1 View  Result Date: 10/21/2019 CLINICAL DATA:  Acute right hip fracture. EXAM: CHEST  1 VIEW COMPARISON:  02/07/2016 FINDINGS: Heart size is normal. Chronic aortic atherosclerosis. The lungs are clear. No edema. No infiltrate, collapse or effusion. Old healed right humeral fracture. Previously augmented T12 or L1 compression fracture. IMPRESSION: No active cardiopulmonary disease.  Aortic atherosclerosis. Electronically Signed   By: 02/09/2016 M.D.   On: 10/21/2019 13:44   CT HEAD WO CONTRAST  Result Date: 10/21/2019 CLINICAL DATA:  Fall EXAM:  CT HEAD WITHOUT CONTRAST TECHNIQUE: Contiguous axial images were obtained from the base of the skull through the vertex without intravenous contrast. COMPARISON:  None. FINDINGS: Brain: There is no mass, hemorrhage or extra-axial collection. The size and configuration of the ventricles and extra-axial CSF spaces are normal. There is hypoattenuation of the white matter, most commonly indicating chronic small vessel disease. Vascular: Atherosclerotic calcification of the vertebral and internal carotid arteries at the skull base. No abnormal hyperdensity of the major intracranial arteries or dural venous sinuses. Skull: The visualized skull base, calvarium and extracranial soft tissues are normal. Sinuses/Orbits: No fluid levels or advanced mucosal thickening of the visualized paranasal sinuses. No mastoid or middle ear effusion. The orbits are normal. IMPRESSION: Chronic small vessel disease without acute intracranial abnormality. Electronically Signed   By: Ulyses Jarred M.D.   On:  10/21/2019 19:41   DG HIP OPERATIVE UNILAT W OR W/O PELVIS RIGHT  Result Date: 10/22/2019 CLINICAL DATA:  Right hip fracture. EXAM: OPERATIVE right HIP (WITH PELVIS IF PERFORMED) 4 VIEWS, POSTOPERATIVE VIEWS OF THE RIGHT HIP. TECHNIQUE: Fluoroscopic spot image(s) were submitted for interpretation post-operatively. COMPARISON:  Radiographs 10/21/2019 FINDINGS: Fluoroscopic intraoperative spot films and postoperative radiographs demonstrate a long intramedullary gamma nail in the femur with a proximal dynamic hip screw and a single distal interlocking screw. The dynamic hip screw appears to be in the fracture. There is approximately slightly more than 1 cortex width of medial displacement. IMPRESSION: Internal fixation of comminuted intertrochanteric fracture. Electronically Signed   By: Marijo Sanes M.D.   On: 10/22/2019 17:25   DG Hip Unilat W or Wo Pelvis 2-3 Views Right  Result Date: 10/21/2019 CLINICAL DATA:  Fall with right hip pain. EXAM: DG HIP (WITH OR WITHOUT PELVIS) 2-3V RIGHT COMPARISON:  None. FINDINGS: Acute intertrochanteric to subtrochanteric fracture of the right femur with lateral angulation. No evidence of pelvic fracture. IMPRESSION: Comminuted right intertrochanteric to subtrochanteric fracture with lateral angulation. Electronically Signed   By: Nelson Chimes M.D.   On: 10/21/2019 13:43   DG FEMUR, MIN 2 VIEWS RIGHT  Result Date: 10/22/2019 CLINICAL DATA:  Right hip fracture. EXAM: OPERATIVE right HIP (WITH PELVIS IF PERFORMED) 4 VIEWS, POSTOPERATIVE VIEWS OF THE RIGHT HIP. TECHNIQUE: Fluoroscopic spot image(s) were submitted for interpretation post-operatively. COMPARISON:  Radiographs 10/21/2019 FINDINGS: Fluoroscopic intraoperative spot films and postoperative radiographs demonstrate a long intramedullary gamma nail in the femur with a proximal dynamic hip screw and a single distal interlocking screw. The dynamic hip screw appears to be in the fracture. There is approximately slightly  more than 1 cortex width of medial displacement. IMPRESSION: Internal fixation of comminuted intertrochanteric fracture. Electronically Signed   By: Marijo Sanes M.D.   On: 10/22/2019 17:25    Assessment/Plan: 1 Day Post-Op   Principal Problem:   Intertrochanteric fracture of right hip (HCC) Active Problems:   HTN (hypertension)   Chronic diastolic CHF (congestive heart failure) (HCC)   Depression   Acute renal failure superimposed on stage 3a chronic kidney disease ([employer])   Fall at home, initial encounter   Rheumatoid arthritis ([place])  The patient was sent to stepdown postoperatively due to hypotension in the PACU.  Her blood pressure appears improved.  Patient has a hemoglobin of 7.0 and would benefit from a transfusion of 1 unit of packed red blood cells.  Patient will be nonweightbearing on the right lower extremity for now given the comminution of her fracture.  Dressing will be changed tomorrow.    Thornton Park ,  MD 10/23/2019, 12:29 PM

## 2019-10-23 NOTE — Progress Notes (Signed)
Nutrition Follow-up  DOCUMENTATION CODES:   Not applicable  INTERVENTION:  Provide Ensure Enlive po BID, each supplement provides 350 kcal and 20 grams of protein. Patient prefers vanilla.  Continue daily MVI.  NUTRITION DIAGNOSIS:   Increased nutrient needs related to post-op healing, hip fracture as evidenced by estimated needs.  Ongoing.  GOAL:   Patient will meet greater than or equal to 90% of their needs  Progressing.  MONITOR:   Weight trends, Labs, Diet advancement, Skin, I & O's  REASON FOR ASSESSMENT:   Consult Assessment of nutrition requirement/status  ASSESSMENT:   86 year old female admitted for intertrochanteric fracture of right hip after tripping over steps at home and falling backwards per report of her grandson. Past medical history significant of HTN, HLD, depression, dCHF, rheumatoid arthritis, and CKD stage III.  Met with patient and her son at bedside. Patient is s/p IM fixation of comminuted four-part intertrochanteric hip fracture yesterday. Patient had post-op hypotension and was brought to stepdown for overnight monitoring. Diet was advanced to regular yesterday evening. Patient was sitting up eating her lunch at time of RD assessment. She reports she has a good appetite and intake at home and that her family ensures she has plenty of food. She has a caregiver Monday-Thursday. She typically eats 2-3 meals per day. She has cereal for breakfast. She has her largest meal at lunch and then has a small meal at dinner. She drinks Ensure occasionally (approximately once per week) and enjoys the vanilla flavor. Discussed increased nutrient needs for post-operative healing. Patient is amenable to drinking Ensure between meals to help meet protein/calorie needs.  Patient denies any unintentional weight loss and reports she is weight-stable. She reports that her UBW is 134 lbs. Patient's current weight in chart is 66.7 kg (147.05 lbs). No recent weights in chart  to trend.  Medications reviewed and include: Oscal with D 1 tablet daily, vitamin D 1000 units daily, Colace 662 mg BID, folic acid 1 mg daily, MVI daily, senna 1 tablet daily, vitamin B12 1000 micrograms, NS at 75 mL/hr, phenylephrine gtt at 50 mcg/min.  Labs reviewed: BUN 28, Creatinine 1.17.  Patient does not meet criteria for malnutrition at this time.  NUTRITION - FOCUSED PHYSICAL EXAM:    Most Recent Value  Orbital Region No depletion  Upper Arm Region No depletion  Thoracic and Lumbar Region No depletion  Buccal Region No depletion  Temple Region Mild depletion  Clavicle Bone Region No depletion  Clavicle and Acromion Bone Region Mild depletion  Scapular Bone Region No depletion  Dorsal Hand Mild depletion  Patellar Region No depletion  Anterior Thigh Region No depletion  Posterior Calf Region No depletion  Edema (RD Assessment) None  Hair Reviewed  Eyes Reviewed  Mouth Reviewed  Skin Reviewed  Nails Reviewed     Diet Order:   Diet Order            Diet regular Room service appropriate? Yes; Fluid consistency: Thin  Diet effective now                EDUCATION NEEDS:   No education needs have been identified at this time  Skin:  Skin Assessment: Skin Integrity Issues: (closed incision right hip)  Last BM:  Unknown/PTA  Height:   Ht Readings from Last 1 Encounters:  10/21/19 '5\' 2"'$  (1.575 m)   Weight:   Wt Readings from Last 1 Encounters:  10/21/19 66.7 kg   BMI:  Body mass index is 26.9  kg/m.  Estimated Nutritional Needs:   Kcal:  1600-1800  Protein:  70-80  Fluid:  1.5 L/day  Jacklynn Barnacle, MS, RD, LDN Pager number available on Amion

## 2019-10-24 LAB — BASIC METABOLIC PANEL
Anion gap: 4 — ABNORMAL LOW (ref 5–15)
BUN: 26 mg/dL — ABNORMAL HIGH (ref 8–23)
CO2: 29 mmol/L (ref 22–32)
Calcium: 7.4 mg/dL — ABNORMAL LOW (ref 8.9–10.3)
Chloride: 108 mmol/L (ref 98–111)
Creatinine, Ser: 0.78 mg/dL (ref 0.44–1.00)
GFR calc Af Amer: >60 mL/min (ref >60)
GFR calc non Af Amer: >60 mL/min (ref >60)
Glucose, Bld: 108 mg/dL — ABNORMAL HIGH (ref 70–99)
Potassium: 4.3 mmol/L (ref 3.5–5.1)
Sodium: 141 mmol/L (ref 135–145)

## 2019-10-24 LAB — CBC
HCT: 21.5 % — ABNORMAL LOW (ref 36.0–46.0)
Hemoglobin: 6.9 g/dL — ABNORMAL LOW (ref 12.0–15.0)
MCH: 30.1 pg (ref 26.0–34.0)
MCHC: 32.1 g/dL (ref 30.0–36.0)
MCV: 93.9 fL (ref 80.0–100.0)
Platelets: 112 10*3/uL — ABNORMAL LOW (ref 150–400)
RBC: 2.29 MIL/uL — ABNORMAL LOW (ref 3.87–5.11)
RDW: 17.1 % — ABNORMAL HIGH (ref 11.5–15.5)
WBC: 8.3 10*3/uL (ref 4.0–10.5)
nRBC: 0.2 % (ref 0.0–0.2)

## 2019-10-24 LAB — PREPARE RBC (CROSSMATCH)

## 2019-10-24 LAB — HEMOGLOBIN AND HEMATOCRIT, BLOOD
HCT: 23.2 % — ABNORMAL LOW (ref 36.0–46.0)
Hemoglobin: 7.6 g/dL — ABNORMAL LOW (ref 12.0–15.0)

## 2019-10-24 LAB — MAGNESIUM: Magnesium: 1.8 mg/dL (ref 1.7–2.4)

## 2019-10-24 MED ORDER — ENOXAPARIN SODIUM 40 MG/0.4ML ~~LOC~~ SOLN: 40.0000 mg | SUBCUTANEOUS | Status: DC

## 2019-10-24 MED ORDER — FUROSEMIDE 10 MG/ML IJ SOLN
40.0000 mg | Freq: Once | INTRAMUSCULAR | Status: AC
  Administered 2019-10-24: 40 mg via INTRAVENOUS
  Filled 2019-10-24: qty 4

## 2019-10-24 MED ORDER — SODIUM CHLORIDE 0.9% IV SOLUTION: Freq: Once | INTRAVENOUS | Status: DC

## 2019-10-24 NOTE — [place] FL2 (Signed)
[place] MEDICAID FL2 LEVEL OF CARE SCREENING TOOL     IDENTIFICATION  Patient Name: Yvonne Knight Birthdate: 13-Dec-1925 Sex: female Admission Date (Current Location): 10/21/2019  [place] and [place] Number:  Chiropodist and Address:  [facility], [address] [place], [place] [address]      Provider Number: [id number]  Attending Physician Name and Address:  Darlin Priestly, MD  Relative Name and Phone Number:  Leckey,Clinton Shari Heritage) [phone]    Current Level of Care: Hospital Recommended Level of Care: Skilled Nursing Facility Prior Approval Number:    Date Approved/Denied:   PASRR Number: [id number]  Discharge Plan:      Current Diagnoses: Patient Active Problem List   Diagnosis Date Noted  . Chronic diastolic CHF (congestive heart failure) (HCC) 10/21/2019  . Depression 10/21/2019  . Acute renal failure superimposed on stage 3a chronic kidney disease (HCC) 10/21/2019  . Fall at home, initial encounter 10/21/2019  . Intertrochanteric fracture of right hip (HCC) 10/21/2019  . Rheumatoid arthritis (HCC) 10/21/2019  . Humerus fracture 02/05/2016  . Pubic ramus fracture (HCC) 02/05/2016  . Ulnar fracture 02/05/2016  . HTN (hypertension) 02/05/2016  . Fall with injury 02/05/2016    Orientation RESPIRATION BLADDER Height & Weight     Self, Time, Situation, Place  Normal External catheter Weight: 147 lb 0.8 oz (66.7 kg) Height:  5\' 2"  (157.5 cm)  BEHAVIORAL SYMPTOMS/MOOD NEUROLOGICAL BOWEL NUTRITION STATUS        Diet (Regular diet)  AMBULATORY STATUS COMMUNICATION OF NEEDS Skin   Extensive Assist Verbally Bruising (surgical incision)                       Personal Care Assistance Level of Assistance  Bathing, Dressing Bathing Assistance: Maximum assistance   Dressing Assistance: Maximum assistance     Functional Limitations Info             SPECIAL CARE FACTORS FREQUENCY  PT (By licensed PT), OT (By licensed  OT)     PT Frequency: 5 times per week OT Frequency: 5 times per week            Contractures      Additional Factors Info  Code Status, Allergies Code Status Info: Full Allergies Info: NKA           Current Medications (10/24/2019):  This is the current hospital active medication list Current Facility-Administered Medications  Medication Dose Route Frequency Provider Last Rate Last Admin  . 0.9 %  sodium chloride infusion (Manually program via Guardrails IV Fluids)   Intravenous Once 12/24/2019, NP      . acetaminophen (TYLENOL) tablet 650 mg  650 mg Oral Q6H PRN Manuela Schwartz, MD   650 mg at 10/24/19 1219  . alum & mag hydroxide-simeth (MAALOX/MYLANTA) 200-200-20 MG/5ML suspension 30 mL  30 mL Oral Q4H PRN 11-21-2000, MD      . bisacodyl (DULCOLAX) suppository 10 mg  10 mg Rectal Daily PRN Juanell Fairly, MD      . calcium-vitamin D (OSCAL WITH D) 500-200 MG-UNIT per tablet 1 tablet  1 tablet Oral Daily Juanell Fairly, MD   1 tablet at 10/24/19 1009  . Chlorhexidine Gluconate Cloth 2 % PADS 6 each  6 each Topical Daily 12/24/19, MD   6 each at 10/24/19 1441  . cholecalciferol (VITAMIN D) tablet 1,000 Units  1,000 Units Oral Daily 12/24/19, MD   1,000 Units at 10/24/19 1009  .  citalopram (CELEXA) tablet 20 mg  20 mg Oral Daily Thornton Park, MD   20 mg at 10/24/19 1009  . docusate sodium (COLACE) capsule 100 mg  100 mg Oral BID Thornton Park, MD   100 mg at 10/24/19 1009  . enoxaparin (LOVENOX) injection 30 mg  30 mg Subcutaneous Q24H Rito Ehrlich A, RPH   30 mg at 10/24/19 1008  . feeding supplement (ENSURE ENLIVE) (ENSURE ENLIVE) liquid 237 mL  237 mL Oral BID BM Enzo Bi, MD   237 mL at 10/24/19 1449  . folic acid (FOLVITE) tablet 1 mg  1 mg Oral Daily Thornton Park, MD   1 mg at 10/24/19 1009  . hydrALAZINE (APRESOLINE) injection 5 mg  5 mg Intravenous Q2H PRN Thornton Park, MD      . HYDROcodone-acetaminophen [facility]) 7.5-325 MG per  tablet 1-2 tablet  1-2 tablet Oral Q4H PRN Thornton Park, MD   1 tablet at 10/24/19 0509  . HYDROcodone-acetaminophen (NORCO/VICODIN) 5-325 MG per tablet 1-2 tablet  1-2 tablet Oral Q4H PRN Thornton Park, MD      . magnesium citrate solution 1 Bottle  1 Bottle Oral Once PRN Thornton Park, MD      . methocarbamol (ROBAXIN) tablet 500 mg  500 mg Oral Q8H PRN Thornton Park, MD   500 mg at 10/21/19 2357  . methotrexate (RHEUMATREX) tablet 10 mg  10 mg Oral Weekly Thornton Park, MD   10 mg at 10/24/19 1009  . morphine 2 MG/ML injection 0.5-1 mg  0.5-1 mg Intravenous Q2H PRN Thornton Park, MD      . multivitamin with minerals tablet 1 tablet  1 tablet Oral Daily Thornton Park, MD   1 tablet at 10/24/19 1009  . OLANZapine (ZYPREXA) tablet 2.5 mg  2.5 mg Oral QHS Thornton Park, MD   2.5 mg at 10/23/19 2145  . ondansetron (ZOFRAN) tablet 4 mg  4 mg Oral Q6H PRN Thornton Park, MD       Or  . ondansetron [facility]) injection 4 mg  4 mg Intravenous Q6H PRN Thornton Park, MD      . polyethylene glycol (MIRALAX / GLYCOLAX) packet 17 g  17 g Oral Daily PRN Thornton Park, MD      . senna (SENOKOT) tablet 8.6 mg  1 tablet Oral BID Thornton Park, MD   8.6 mg at 10/24/19 1009  . senna-docusate (Senokot-S) tablet 1 tablet  1 tablet Oral QHS PRN Thornton Park, MD      . vitamin B-12 (CYANOCOBALAMIN) tablet 1,000 mcg  1,000 mcg Oral Daily Thornton Park, MD   1,000 mcg at 10/24/19 1009     Discharge Medications: Please see discharge summary for a list of discharge medications.  Relevant Imaging Results:  Relevant Lab Results:   Additional Information SS#: [id number]  [place], LCSW

## 2019-10-24 NOTE — Progress Notes (Signed)
PT Cancellation Note  Patient Details Name: Yvonne Knight MRN: [id number] DOB: 30-Jul-1925   Cancelled Treatment:    Reason Eval/Treat Not Completed: Medical issues which prohibited therapy Despite transfusion yesterday with hemoglobin elevating to 7.9 she is now again below 7.0, awaiting transfusion.  Will hold PT until after transfusion and blood levels are more appropriate for activity, plan to see this PM.  Malachi Pro, DPT 10/24/2019, 8:20 AM

## 2019-10-24 NOTE — Progress Notes (Signed)
2000 On initial assessment pt surgical site saturated dressing and bed pad underneath. Intensivist notified and came to bedside, coags and CBC drawn. NP Jon Billings notified.   0100 pt had a moderate amount of bloody drainage and Jon Billings notified.  0500 pt incision site continues to bleed, saturating the bed pad, pt changed again. Labs drown.  Urine output improved from previous night. Foley no longer seems necessary other than patient's extreme discomfort when turning.   Weaning neo as tolerated.

## 2019-10-24 NOTE — Progress Notes (Signed)
PHARMACIST - PHYSICIAN COMMUNICATION  CONCERNING:  Enoxaparin (Lovenox) for DVT Prophylaxis    RECOMMENDATION: Patient was prescribed enoxaparin 30 mg q24 hours for VTE prophylaxis.   Filed Weights   10/21/19 1248  Weight: 66.7 kg (147 lb 0.8 oz)    Body mass index is 26.9 kg/m.  Estimated Creatinine Clearance: 40.2 mL/min (by C-G formula based on SCr of 0.78 mg/dL).   Based on [facility] policy patient is candidate for enoxaparin 40 mg every 24 hours based on CrCl > 71ml/min   DESCRIPTION: Pharmacy has adjusted enoxaparin dose per [facility] policy.  Patient is now receiving enoxaparin 40mg  every 24 hours. **This was discussed with MD as patient has had drop in hemoglobin and transfused x 2**   , PharmD Clinical Pharmacist  10/24/2019 3:53 PM

## 2019-10-24 NOTE — Progress Notes (Signed)
Subjective:  POD #2 s/p IM fixation for right comminuted four-part intertrochanteric hip fracture.   Patient reports right pain as mild.  She has no pain at rest but has increased pain with right hip motion.  Patient recently worked with physical therapy.  Patient has received 2 units of PRBCs for symptomatic anemia as the patient remains borderline hypotensive.  Her son and grandson are at the bedside.  Objective:   VITALS:   Vitals:   10/24/19 1400 10/24/19 1445 10/24/19 1500 10/24/19 1600  BP: (!) 94/29 (!) 122/46 (!) 109/36 (!) 104/52  Pulse: 81 82 85 85  Resp: 19 12 (!) 22 (!) 24  Temp: 99.1 F (37.3 C)     TempSrc: Axillary     SpO2: 95% 95% 95% 96%  Weight:      Height:        PHYSICAL EXAM: Right lower extremity: I have personally change the dressing with the patient's RN today. An Aquacel dressing was applied today.  Patient has serosanguineous drainage from her proximal incisions.  Patient's thigh is soft and compressible.  There is no erythema or significant edema.  There is no evidence of postoperative infection at this time. Neurovascular intact Sensation intact distally Intact pulses distally Dorsiflexion/Plantar flexion intact Incision: mild serosanguinous drainage from proximal incisions.  Dressings saturated and changed. No cellulitis present Compartment soft  LABS  Results for orders placed or performed during the hospital encounter of 10/21/19 (from the past 24 hour(s))  CBC     Status: Abnormal   Collection Time: 10/23/19  8:58 PM  Result Value Ref Range   WBC 9.0 4.0 - 10.5 K/uL   RBC 2.50 (L) 3.87 - 5.11 MIL/uL   Hemoglobin 7.7 (L) 12.0 - 15.0 g/dL   HCT 06.2 (L) 36 - 46 %   MCV 90.0 80.0 - 100.0 fL   MCH 30.8 26.0 - 34.0 pg   MCHC 34.2 30.0 - 36.0 g/dL   RDW 37.6 (H) 28.3 - 15.1 %   Platelets 126 (L) 150 - 400 K/uL   nRBC 0.0 0.0 - 0.2 %  Protime-INR     Status: Abnormal   Collection Time: 10/23/19  8:58 PM  Result Value Ref Range    Prothrombin Time 15.8 (H) 11.4 - 15.2 seconds   INR 1.3 (H) 0.8 - 1.2  APTT     Status: Abnormal   Collection Time: 10/23/19  8:58 PM  Result Value Ref Range   aPTT 41 (H) 24 - 36 seconds  Basic metabolic panel     Status: Abnormal   Collection Time: 10/24/19  4:41 AM  Result Value Ref Range   Sodium 141 135 - 145 mmol/L   Potassium 4.3 3.5 - 5.1 mmol/L   Chloride 108 98 - 111 mmol/L   CO2 29 22 - 32 mmol/L   Glucose, Bld 108 (H) 70 - 99 mg/dL   BUN 26 (H) 8 - 23 mg/dL   Creatinine, Ser 7.61 0.44 - 1.00 mg/dL   Calcium 7.4 (L) 8.9 - 10.3 mg/dL   GFR calc non Af Amer >60 >60 mL/min   GFR calc Af Amer >60 >60 mL/min   Anion gap 4 (L) 5 - 15  CBC     Status: Abnormal   Collection Time: 10/24/19  4:41 AM  Result Value Ref Range   WBC 8.3 4.0 - 10.5 K/uL   RBC 2.29 (L) 3.87 - 5.11 MIL/uL   Hemoglobin 6.9 (L) 12.0 - 15.0 g/dL   HCT 60.7 (  L) 36 - 46 %   MCV 93.9 80.0 - 100.0 fL   MCH 30.1 26.0 - 34.0 pg   MCHC 32.1 30.0 - 36.0 g/dL   RDW 17.1 (H) 11.5 - 15.5 %   Platelets 112 (L) 150 - 400 K/uL   nRBC 0.2 0.0 - 0.2 %  Magnesium     Status: None   Collection Time: 10/24/19  4:41 AM  Result Value Ref Range   Magnesium 1.8 1.7 - 2.4 mg/dL  Prepare RBC (crossmatch)     Status: None   Collection Time: 10/24/19  6:33 AM  Result Value Ref Range   Order Confirmation      ORDER PROCESSED BY BLOOD BANK Performed at [facility], [address] [place], [place] [address]   Hemoglobin and hematocrit, blood     Status: Abnormal   Collection Time: 10/24/19 12:12 PM  Result Value Ref Range   Hemoglobin 7.6 (L) 12.0 - 15.0 g/dL   HCT 23.2 (L) 36 - 46 %    No results found.  Assessment/Plan: 2 Days Post-Op   Principal Problem:   Intertrochanteric fracture of right hip (HCC) Active Problems:   HTN (hypertension)   Chronic diastolic CHF (congestive heart failure) (HCC)   Depression   Acute renal failure superimposed on stage 3a chronic kidney disease ([place])   Fall at  home, initial encounter   Rheumatoid arthritis [facility])  Patient has received significant amount of fluid support postoperatively.  Patient is being diuresed by Dr. Billie Ruddy, the hospitalist.  I have recommended holding Lovenox until the patient's hemoglobin stabilizes and her drainage stops.  Patient will remain nonweightbearing on the right lower extremity given the comminution of the fracture.  She will continue physical therapy as her pain allows.  Dressings may be changed or reinforced as needed.  Patient may be discharged on Lovenox or enteric-coated aspirin 325 mg p.o. twice daily for DVT prophylaxis.  She will follow-up with me in the office in 10 to 14 days after discharge.    Thornton Park , MD 10/24/2019, 4:59 PM

## 2019-10-24 NOTE — Plan of Care (Signed)
  Problem: Education: Goal: Knowledge of General Education information will improve Description: Including pain rating scale, medication(s)/side effects and non-pharmacologic comfort measures Outcome: Progressing   Problem: Health Behavior/Discharge Planning: Goal: Ability to manage health-related needs will improve Outcome: Progressing   Problem: Clinical Measurements: Goal: Ability to maintain clinical measurements within normal limits will improve Outcome: Progressing Goal: Will remain free from infection Outcome: Progressing Goal: Diagnostic test results will improve Outcome: Progressing Goal: Respiratory complications will improve Outcome: Progressing Goal: Cardiovascular complication will be avoided Outcome: Progressing   Problem: Activity: Goal: Risk for activity intolerance will decrease Outcome: Not Progressing   Problem: Nutrition: Goal: Adequate nutrition will be maintained Outcome: Not Progressing   Problem: Coping: Goal: Level of anxiety will decrease Outcome: Progressing   Problem: Elimination: Goal: Will not experience complications related to bowel motility Outcome: Not Progressing Goal: Will not experience complications related to urinary retention Outcome: Progressing   Problem: Pain Managment: Goal: General experience of comfort will improve Outcome: Progressing   Problem: Safety: Goal: Ability to remain free from injury will improve Outcome: Progressing   Problem: Skin Integrity: Goal: Risk for impaired skin integrity will decrease Outcome: Progressing   

## 2019-10-24 NOTE — Progress Notes (Addendum)
Shift Summary: Pt received 1 RBC for Hgb of 6.9. Post transfusion Hgb was 7.6. Pt drowsy most of shift, possibly d/t pain medication from early morning. Pt Ox2.  Neo-synephrine gtt off per MD order. Maintenance fluids also off per MD order. Pt BP goal is systolic > 90. Pt BP goal maintained. Pt ensure given and pt had small appetite. Foley removed per order. Ortho came and changed R hip wound dressings that had serosanguineous output. Pt did have elevated fever. Tylenol given per order, BC ordered, and wound culture ordered. R hip wound was WNL. No signs of infection. Pt worked with pt and sat on side of the bed.

## 2019-10-24 NOTE — Progress Notes (Signed)
PROGRESS NOTE    Yvonne Knight  NWG:[id number] DOB: May 05, 1926 DOA: 10/21/2019 PCP: Barbette Reichmann, MD    Assessment & Plan:   Principal Problem:   Intertrochanteric fracture of right hip (HCC) Active Problems:   HTN (hypertension)   Chronic diastolic CHF (congestive heart failure) (HCC)   Depression   Acute renal failure superimposed on stage 3a chronic kidney disease (HCC)   Fall at home, initial encounter   Rheumatoid arthritis (HCC)    Yvonne Knight is a 86 y.o. Caucasian female with medical history significant of hypertension, hyperlipidemia, depression, dCHF, rheumatoid arthritis, CKD stage III, who presented with mechanical fall, right hip pain.  Intertrochanteric fracture of right hip s/p RIGHT INTRAMEDULLARY (IM) NAIL INTERTROCHANTRIC on 10/23/19 X-ray of pelvis showed comminuted right intertrochanteric to subtrochanteric fracture with lateral angulation. No neurovascular compromise. Orthopedic surgeon, Dr. Aalaya Clan performed the surgery. --serosanguineous drainage from her proximal incisions developed overnight PLAN: - Pain control: percocet PRN  - Robaxin for muscle spasm --PT/OT  --Dressing changed by ortho today  Post-op hypotension likely due to anesthesia -blood loss ~150 ml.  Received about 2.5L IVF.  Neo 100 mcg x3 in the PACU.  BP 90's. --due to age, multiple comorbidities, pt was transferred to stepdown for close monitoring and low-dose pressor PLAN: --d/c MIVF today --d/c Neo gtt today --Keep systolic above 90's --Hold all BP meds.  Post-op fever --mild fever of 100.7 today.  No signs of infection at surgical site. --blood cx x2 obtained. --Hold abx for now, start empiric vanc/cefepime if fever returns.  Chronic diastolic CHF (congestive heart failure) (HCC): 2D echo on 07/30/2018 showed EF >55%.  Patient has a trace leg edema, but no JVD.  No shortness of breath.  Chest x-ray has no pulmonary edema.  BNP is elevated 397.  Patient does  not have CHF exacerbation --developed generalized swelling due to IVF resuscitation; no dyspnea or hypoxia PLAN: --IV lasix 40 x1   Depression: -Celexa, olanzapine  Acute renal failure superimposed on stage 3a chronic kidney disease (HCC): Baseline Cre is 0.8 on 07/23/19, pt's Cre is 1.27  and BUN  23 on admission. Likely due to dehydration and continuation of ARB, diuretics --d/c MIVF today - Hold Cozaar  Fall at home, initial encounter: -pt/ot  Rheumatoid arthritis: -Continue methotrexate   DVT prophylaxis: Lovenox SQ Code Status: Full code  Family Communication: son and grandson updated at bedside today Status is: inpatient Dispo:   The patient is from: home Anticipated d/c is to: SNF rehab Anticipated d/c date is: 2-3 days Patient currently is not medically stable to d/c due to: post-op fever, drainage from surgical site, fluid overloaded needing diuresis   Subjective and Interval History:  Overnight, bleeding was noted from surgical site.  Another unit RBC given.  Pt was somnolent from pain meds.  Developed mild fever during the day.  Blood cx obtained.  Denied dyspnea.  Developed generalized swelling.   Objective: Vitals:   10/24/19 1445 10/24/19 1500 10/24/19 1600 10/24/19 1700  BP: (!) 122/46 (!) 109/36 (!) 104/52 (!) 112/34  Pulse: 82 85 85 84  Resp: 12 (!) 22 (!) 24 20  Temp:   97.8 F (36.6 C)   TempSrc:   Axillary   SpO2: 95% 95% 96% 96%  Weight:      Height:        Intake/Output Summary (Last 24 hours) at 10/24/2019 1803 Last data filed at 10/24/2019 1400 Gross per 24 hour  Intake 3488.28 ml  Output 700 ml  Net 2788.28 ml   Filed Weights   10/21/19 1248  Weight: 66.7 kg    Examination:   Constitutional: NAD, more lethargic today, oriented HEENT: conjunctivae and lids normal, EOMI CV: RRR 2+ systolic murmur at upper sternal boarder. Distal pulses +2.  No cyanosis.   RESP: CTA B/L, normal respiratory effort, on RA  GI: +BS,  NTND Extremities: generalized swelling in all extremities SKIN: warm, dry and intact Neuro: II - XII grossly intact.  Sensation intact   Data Reviewed: I have personally reviewed following labs and imaging studies  CBC: Recent Labs  Lab 10/21/19 1250 10/21/19 1250 10/22/19 0429 10/22/19 0429 10/23/19 0429 10/23/19 1541 10/23/19 2058 10/24/19 0441 10/24/19 1212  WBC 8.0  --  7.9  --  9.2  --  9.0 8.3  --   NEUTROABS 4.6  --   --   --   --   --   --   --   --   HGB 12.7   < > 10.5*   < > 7.0* 7.9* 7.7* 6.9* 7.6*  HCT 38.9   < > 32.3*   < > 21.5* 23.2* 22.5* 21.5* 23.2*  MCV 91.1  --  91.8  --  91.5  --  90.0 93.9  --   PLT 174  --  162  --  158  --  126* 112*  --    < > = values in this interval not displayed.   Basic Metabolic Panel: Recent Labs  Lab 10/21/19 1250 10/22/19 0429 10/23/19 0429 10/24/19 0441  NA 142 143 142 141  K 3.8 3.9 4.1 4.3  CL 104 107 106 108  CO2 30 31 26 29   GLUCOSE 125* 175* 137* 108*  BUN 23 26* 28* 26*  CREATININE 1.27* 1.14* 1.17* 0.78  CALCIUM 8.7* 8.3* 7.6* 7.4*  MG  --   --  1.9 1.8   GFR: Estimated Creatinine Clearance: 40.2 mL/min (by C-G formula based on SCr of 0.78 mg/dL). Liver Function Tests: Recent Labs  Lab 10/21/19 1250  AST 20  ALT 15  ALKPHOS 54  BILITOT 0.6  PROT 6.3*  ALBUMIN 3.7   No results for input(s): LIPASE, AMYLASE in the last 168 hours. No results for input(s): AMMONIA in the last 168 hours. Coagulation Profile: Recent Labs  Lab 10/21/19 2020 10/23/19 2058  INR 1.1 1.3*   Cardiac Enzymes: No results for input(s): CKTOTAL, CKMB, CKMBINDEX, TROPONINI in the last 168 hours. BNP (last 3 results) No results for input(s): PROBNP in the last 8760 hours. HbA1C: No results for input(s): HGBA1C in the last 72 hours. CBG: No results for input(s): GLUCAP in the last 168 hours. Lipid Profile: No results for input(s): CHOL, HDL, LDLCALC, TRIG, CHOLHDL, LDLDIRECT in the last 72 hours. Thyroid Function  Tests: No results for input(s): TSH, T4TOTAL, FREET4, T3FREE, THYROIDAB in the last 72 hours. Anemia Panel: No results for input(s): VITAMINB12, FOLATE, FERRITIN, TIBC, IRON, RETICCTPCT in the last 72 hours. Sepsis Labs: No results for input(s): PROCALCITON, LATICACIDVEN in the last 168 hours.  Recent Results (from the past 240 hour(s))  SARS Coronavirus 2 by RT PCR (hospital order, performed in [facility] hospital lab) Nasopharyngeal Nasopharyngeal Swab     Status: None   Collection Time: 10/21/19  2:09 PM   Specimen: Nasopharyngeal Swab  Result Value Ref Range Status   SARS Coronavirus 2 NEGATIVE NEGATIVE Final    Comment: (NOTE) SARS-CoV-2 target nucleic acids are NOT DETECTED. The SARS-CoV-2 RNA is generally detectable  in upper and lower respiratory specimens during the acute phase of infection. The lowest concentration of SARS-CoV-2 viral copies this assay can detect is 250 copies / mL. A negative result does not preclude SARS-CoV-2 infection and should not be used as the sole basis for treatment or other patient management decisions.  A negative result may occur with improper specimen collection / handling, submission of specimen other than nasopharyngeal swab, presence of viral mutation(s) within the areas targeted by this assay, and inadequate number of viral copies (<250 copies / mL). A negative result must be combined with clinical observations, patient history, and epidemiological information. Fact Sheet for Patients:   StrictlyIdeas.no Fact Sheet for Healthcare Providers: BankingDealers.co.za This test is not yet approved or cleared  by the [place] FDA and has been authorized for detection and/or diagnosis of SARS-CoV-2 by FDA under an Emergency Use Authorization (EUA).  This EUA will remain in effect (meaning this test can be used) for the duration of the COVID-19 declaration under Section 564(b)(1) of the Act, 21  U.S.C. section 360bbb-3(b)(1), unless the authorization is terminated or revoked sooner. Performed at [facility], [address] [place], [place] [address]   MRSA PCR Screening     Status: None   Collection Time: 10/22/19  8:22 PM   Specimen: Nasopharyngeal  Result Value Ref Range Status   MRSA by PCR NEGATIVE NEGATIVE Final    Comment:        The GeneXpert MRSA Assay (FDA approved for NASAL specimens only), is one component of a comprehensive MRSA colonization surveillance program. It is not intended to diagnose MRSA infection nor to guide or monitor treatment for MRSA infections. Performed at [facility], [address], [place], [place] [address]       Radiology Studies: No results found.   Scheduled Meds: . sodium chloride   Intravenous Once  . calcium-vitamin D  1 tablet Oral Daily  . Chlorhexidine Gluconate Cloth  6 each Topical Daily  . cholecalciferol  1,000 Units Oral Daily  . citalopram  20 mg Oral Daily  . docusate sodium  100 mg Oral BID  . feeding supplement (ENSURE ENLIVE)  237 mL Oral BID BM  . folic acid  1 mg Oral Daily  . methotrexate  10 mg Oral Weekly  . multivitamin with minerals  1 tablet Oral Daily  . OLANZapine  2.5 mg Oral QHS  . senna  1 tablet Oral BID  . vitamin B-12  1,000 mcg Oral Daily   Continuous Infusions:    LOS: 3 days     Enzo Bi, MD [facility] If 7PM-7AM, please contact night-coverage 10/24/2019, 6:03 PM

## 2019-10-24 NOTE — Progress Notes (Signed)
Physical Therapy Treatment Patient Details Name: Yvonne Knight MRN: [id number] DOB: 1925-08-28 Today's Date: 10/24/2019    History of Present Illness 86 y.o. female with medical history significant for hypertension, hyperlipidemia, depression, dCHF, rheumatoid arthritis, CKD stage III, who presented with mechanical fall, right hip pain. Imaging showed right comminuted four-part intertrochanteric hip fracture with significant displacement. Pt is now S/p R IM fixation. Pt to be NWB in RLE. Pt sent to stepdown postoperatively due to hypotension in the PACU.    PT Comments    Pt continues to be pleasant and willing to participate but pt clearly more fatigued and lethargic this date.  She showed good effort with bed exercises, but struggled to do much AROM on the R and was relatively limited on the L as well.  Standing attempt was again heavily assisted and though she maintain R NWBing well again she was even less able to assist with weight shift forward or attempt to do any unweighting/hopping on the L.   Follow Up Recommendations  SNF     Equipment Recommendations   (TBD)    Recommendations for Other Services       Precautions / Restrictions Precautions Precautions: Fall Restrictions Weight Bearing Restrictions: Yes RLE Weight Bearing: Non weight bearing    Mobility  Bed Mobility Overal bed mobility: Needs Assistance Bed Mobility: Sit to Supine;Supine to Sit     Supine to sit: Max assist;HOB elevated Sit to supine: Total assist   General bed mobility comments: Heavy assist with to and from supine, minimall ability to give much effort  Transfers Overall transfer level: Needs assistance Equipment used: Rolling walker (2 wheeled) Transfers: Sit to/from Stand Sit to Stand: +2 physical assistance;Max assist         General transfer comment: again able to maintain NWBing on R relatively well but unable to shift weight forward onto L LE and maintains lean back on bed even  with heavy +2 assist to shift forward  Ambulation/Gait                 Stairs             Wheelchair Mobility    Modified Rankin (Stroke Patients Only)       Balance Overall balance assessment: Needs assistance Sitting-balance support: Bilateral upper extremity supported Sitting balance-Leahy Scale: Fair Sitting balance - Comments: Steady static sitting at EOB     Standing balance-Leahy Scale: Zero Standing balance comment: Heavy posterior lean. Less stable than yesterday and unable to even attempt L unweighting, etc                            Cognition Arousal/Alertness: Lethargic   Overall Cognitive Status:  (family present, reports that she is normally rather spry)                                        Exercises General Exercises - Lower Extremity Ankle Circles/Pumps: AROM;10 reps (lacks DF to neutral on L, calf stretches 5 X 15 sec) Quad Sets: Strengthening;15 reps Short Arc Quad: Strengthening;10 reps Heel Slides: AAROM;10 reps (resisted leg extensions on L) Hip ABduction/ADduction: AROM;AAROM;10 reps    General Comments        Pertinent Vitals/Pain Pain Assessment: Faces Faces Pain Scale: Hurts even more Pain Location: not rated this date, though she clearly has increased pain with R  LE movement    Home Living                      Prior Function            PT Goals (current goals can now be found in the care plan section) Progress towards PT goals: Not progressing toward goals - comment (weaker and more lethargic this date)    Frequency    BID      PT Plan Current plan remains appropriate    Co-evaluation              AM-PAC PT "6 Clicks" Mobility   Outcome Measure  Help needed turning from your back to your side while in a flat bed without using bedrails?: A Lot Help needed moving from lying on your back to sitting on the side of a flat bed without using bedrails?: Total Help  needed moving to and from a bed to a chair (including a wheelchair)?: Total Help needed standing up from a chair using your arms (e.g., wheelchair or bedside chair)?: Total Help needed to walk in hospital room?: Total Help needed climbing 3-5 steps with a railing? : Total 6 Click Score: 7    End of Session Equipment Utilized During Treatment: Gait belt Activity Tolerance: Patient limited by pain Patient left: with call bell/phone within reach;in bed Nurse Communication: Mobility status (need for purewick) PT Visit Diagnosis: Muscle weakness (generalized) (M62.81);Difficulty in walking, not elsewhere classified (R26.2);Pain Pain - Right/Left: Right Pain - part of body: Hip     Time: 9147-8295 PT Time Calculation (min) (ACUTE ONLY): 43 min  Charges:  $Therapeutic Exercise: 23-37 mins $Therapeutic Activity: 8-22 mins                     Malachi Pro, DPT 10/24/2019, 6:06 PM

## 2019-10-24 NOTE — TOC Progression Note (Signed)
Transition of Care [facility]) - Progression Note    Patient Details  Name: Yvonne Knight MRN: [id number] Date of Birth: 01-29-1926  Transition of Care [facility]) CM/SW Contact  Liliana Cline, LCSW Phone Number: 10/24/2019, 2:59 PM  Clinical Narrative:   PT recommended SNF when medically ready for discharge. Completed SNF work up. Called [facility] in [place] as patient said this is her first preference. Spoke with Admissions Worker Okey Regal who reported they have no beds available until the middle of next week. Faxed patient's information to [place]. Also sent patient's info out in HUB in case patient is ready to discharge before the middle of next week so that patient has back up options when she is medically ready.    Expected Discharge Plan: Skilled Nursing Facility Barriers to Discharge: Continued Medical Work up  Expected Discharge Plan and Services Expected Discharge Plan: Skilled Nursing Facility     Post Acute Care Choice: Skilled Nursing Facility Living arrangements for the past 2 months: Single Family Home                                       Social Determinants of Health (SDOH) Interventions    Readmission Risk Interventions Readmission Risk Prevention Plan 10/23/2019  Transportation Screening Complete  PCP or Specialist Appt within 3-5 Days Complete  HRI or Home Care Consult Complete  Medication Review (RN Care Manager) Complete  Some recent data might be hidden

## 2019-10-24 NOTE — Progress Notes (Addendum)
Shift summary:  - Received 1 unit PRBC this AM. - Pressors and MIVF off per MD order. - Foley removed and patient voiding using bedpan. - PT today. - Family requesting SNF placement for rehab near [place] at discharge.

## 2019-10-25 ENCOUNTER — Inpatient Hospital Stay: Payer: Medicare Other

## 2019-10-25 DIAGNOSIS — I5032 Chronic diastolic (congestive) heart failure: Secondary | ICD-10-CM

## 2019-10-25 LAB — CBC
HCT: 19.3 % — ABNORMAL LOW (ref 36.0–46.0)
HCT: 21.9 % — ABNORMAL LOW (ref 36.0–46.0)
Hemoglobin: 6.4 g/dL — ABNORMAL LOW (ref 12.0–15.0)
Hemoglobin: 7.3 g/dL — ABNORMAL LOW (ref 12.0–15.0)
MCH: 30 pg (ref 26.0–34.0)
MCH: 29.7 pg (ref 26.0–34.0)
MCHC: 33.2 g/dL (ref 30.0–36.0)
MCHC: 33.3 g/dL (ref 30.0–36.0)
MCV: 90.6 fL (ref 80.0–100.0)
MCV: 89 fL (ref 80.0–100.0)
Platelets: 104 10*3/uL — ABNORMAL LOW (ref 150–400)
Platelets: 126 10*3/uL — ABNORMAL LOW (ref 150–400)
RBC: 2.13 MIL/uL — ABNORMAL LOW (ref 3.87–5.11)
RBC: 2.46 MIL/uL — ABNORMAL LOW (ref 3.87–5.11)
RDW: 16.9 % — ABNORMAL HIGH (ref 11.5–15.5)
RDW: 16.7 % — ABNORMAL HIGH (ref 11.5–15.5)
WBC: 5.7 10*3/uL (ref 4.0–10.5)
WBC: 6.1 10*3/uL (ref 4.0–10.5)
nRBC: 0 % (ref 0.0–0.2)
nRBC: 0 % (ref 0.0–0.2)

## 2019-10-25 LAB — BASIC METABOLIC PANEL
Anion gap: 7 (ref 5–15)
BUN: 36 mg/dL — ABNORMAL HIGH (ref 8–23)
CO2: 25 mmol/L (ref 22–32)
Calcium: 7.6 mg/dL — ABNORMAL LOW (ref 8.9–10.3)
Chloride: 107 mmol/L (ref 98–111)
Creatinine, Ser: 0.85 mg/dL (ref 0.44–1.00)
GFR calc Af Amer: >60 mL/min (ref >60)
GFR calc non Af Amer: 59 mL/min — ABNORMAL LOW (ref >60)
Glucose, Bld: 110 mg/dL — ABNORMAL HIGH (ref 70–99)
Potassium: 4.1 mmol/L (ref 3.5–5.1)
Sodium: 139 mmol/L (ref 135–145)

## 2019-10-25 LAB — MAGNESIUM: Magnesium: 2 mg/dL (ref 1.7–2.4)

## 2019-10-25 LAB — PROCALCITONIN: Procalcitonin: <0.1 ng/mL

## 2019-10-25 MED ORDER — SODIUM CHLORIDE 0.9 % IV SOLN
2.0000 g | Freq: Two times a day (BID) | INTRAVENOUS | Status: AC
  Administered 2019-10-25 – 2019-10-27 (×6): 2 g via INTRAVENOUS
  Filled 2019-10-25 (×7): qty 2

## 2019-10-25 MED ORDER — VANCOMYCIN HCL 1500 MG/300ML IV SOLN
1500.0000 mg | Freq: Once | INTRAVENOUS | Status: AC
  Administered 2019-10-25: 1500 mg via INTRAVENOUS
  Filled 2019-10-25: qty 300

## 2019-10-25 MED ORDER — FUROSEMIDE 10 MG/ML IJ SOLN
40.0000 mg | Freq: Once | INTRAMUSCULAR | Status: AC
  Administered 2019-10-25: 40 mg via INTRAVENOUS
  Filled 2019-10-25: qty 4

## 2019-10-25 MED ORDER — SODIUM CHLORIDE 0.9 % IV SOLN
INTRAVENOUS | Status: DC | PRN
  Administered 2019-10-25: 1000 mL via INTRAVENOUS
  Administered 2019-10-26: 250 mL via INTRAVENOUS

## 2019-10-25 MED ORDER — METOLAZONE 5 MG PO TABS
5.0000 mg | ORAL_TABLET | Freq: Once | ORAL | Status: AC
  Administered 2019-10-25: 5 mg via ORAL
  Filled 2019-10-25: qty 1

## 2019-10-25 MED ORDER — VANCOMYCIN HCL IN DEXTROSE 1-5 GM/200ML-% IV SOLN
1000.0000 mg | INTRAVENOUS | Status: AC
  Administered 2019-10-26 – 2019-10-27 (×2): 1000 mg via INTRAVENOUS
  Filled 2019-10-25 (×2): qty 200

## 2019-10-25 MED ORDER — SODIUM CHLORIDE 0.9% IV SOLUTION: Freq: Once | INTRAVENOUS | Status: DC

## 2019-10-25 NOTE — Plan of Care (Signed)

## 2019-10-25 NOTE — Progress Notes (Addendum)
Shift summary:  - Ordered to receive 2 units PRBC this AM.  - Plan to replace Foley per MD for strict I+O.  -PRBC transfusion initiated at 1120 hrs.   - Per the patient's family, if the patient is to need SNF at discharge, the preferred area is [facility].

## 2019-10-25 NOTE — Progress Notes (Signed)
PT Cancellation Note  Patient Details Name: Yvonne Knight MRN: [id number] DOB: 01/05/1926   Cancelled Treatment:    Reason Eval/Treat Not Completed: Medical issues which prohibited therapy (Patient is now again below 7.0, awaiting transfusion.  Will hold PT until after transfusion and blood levels are more appropriate for activity, plan to see this PM.)   Precious Bard, PT, DPT   10/25/2019, 8:56 AM

## 2019-10-25 NOTE — Progress Notes (Signed)
Subjective:  Patient reports pain as mild.  Currently being transfused PRBC's.  Her son is bedside.   Objective:   VITALS:   Vitals:   10/25/19 1115 10/25/19 1120 10/25/19 1200 10/25/19 1300  BP: (!) 105/41  (!) 114/39 (!) 115/40  Pulse: 88 90 88 89  Resp: (!) 29 (!) 23 (!) 27 (!) 30  Temp:  100.3 F (37.9 C)    TempSrc:  Axillary    SpO2: 97% 98% 97% 95%  Weight:      Height:        PHYSICAL EXAM:  ABD soft Sensation intact distally Dorsiflexion/Plantar flexion intact Incision: Aquacel soaked Compartment soft  LABS  Results for orders placed or performed during the hospital encounter of 10/21/19 (from the past 24 hour(s))  Aerobic Culture (superficial specimen)     Status: None (Preliminary result)   Collection Time: 10/24/19  2:40 PM   Specimen: Wound  Result Value Ref Range   Specimen Description      WOUND Performed at [facility], [address] [place], [place] [address]    Special Requests      NONE Performed at [facility], [address] [place], [place] [address]    Gram Stain NO WBC SEEN NO ORGANISMS SEEN     Culture      NO GROWTH < 24 HOURS Performed at [facility], [address], [place], [place] [address]    Report Status PENDING   Basic metabolic panel     Status: Abnormal   Collection Time: 10/25/19  4:33 AM  Result Value Ref Range   Sodium 139 135 - 145 mmol/L   Potassium 4.1 3.5 - 5.1 mmol/L   Chloride 107 98 - 111 mmol/L   CO2 25 22 - 32 mmol/L   Glucose, Bld 110 (H) 70 - 99 mg/dL   BUN 36 (H) 8 - 23 mg/dL   Creatinine, Ser 7.03 0.44 - 1.00 mg/dL   Calcium 7.6 (L) 8.9 - 10.3 mg/dL   GFR calc non Af Amer 59 (L) >60 mL/min   GFR calc Af Amer >60 >60 mL/min   Anion gap 7 5 - 15  CBC     Status: Abnormal   Collection Time: 10/25/19  4:33 AM  Result Value Ref Range   WBC 5.7 4.0 - 10.5 K/uL   RBC 2.13 (L) 3.87 - 5.11 MIL/uL   Hemoglobin 6.4 (L) 12.0 - 15.0 g/dL   HCT 50.0 (L) 36 - 46 %   MCV  90.6 80.0 - 100.0 fL   MCH 30.0 26.0 - 34.0 pg   MCHC 33.2 30.0 - 36.0 g/dL   RDW 93.8 (H) 18.2 - 99.3 %   Platelets 104 (L) 150 - 400 K/uL   nRBC 0.0 0.0 - 0.2 %  Magnesium     Status: None   Collection Time: 10/25/19  4:33 AM  Result Value Ref Range   Magnesium 2.0 1.7 - 2.4 mg/dL  Prepare RBC (crossmatch)     Status: None   Collection Time: 10/25/19  6:52 AM  Result Value Ref Range   Order Confirmation      ORDER PROCESSED BY BLOOD BANK Performed at [facility], [address] [place], [place] [address]   Type and screen [facility]     Status: None (Preliminary result)   Collection Time: 10/25/19  7:08 AM  Result Value Ref Range   ABO/RH(D) A POS    Antibody Screen NEG  Sample Expiration 10/28/2019,2359    Unit Number [id number]    Blood Component Type RED CELLS,LR    Unit division 00    Status of Unit ALLOCATED    Transfusion Status OK TO TRANSFUSE    Crossmatch Result Compatible    Unit Number [id number]    Blood Component Type RED CELLS,LR    Unit division 00    Status of Unit ISSUED    Transfusion Status OK TO TRANSFUSE    Crossmatch Result      Compatible Performed at [facility], [address] [place], [place] [address]     No results found.  Assessment/Plan: 3 Days Post-Op   Principal Problem:   Intertrochanteric fracture of right hip (HCC) Active Problems:   HTN (hypertension)   Chronic diastolic CHF (congestive heart failure) (HCC)   Depression   Acute renal failure superimposed on stage 3a chronic kidney disease ([place])   Fall at home, initial encounter   Rheumatoid arthritis ([place])   Up with therapy Continue to hold Lovenox.  Patient will remain nonweightbearing on the right lower extremity given the comminution of the fracture.  She will continue physical therapy as her pain allows. Dressing is removed and cleaned. A hip compressive dressing is applied.  Continue to follow.   Lovell Sheehan , MD 10/25/2019, 1:37 PM

## 2019-10-25 NOTE — Progress Notes (Signed)
PT Cancellation Note  Patient Details Name: Yvonne Knight MRN: [id number] DOB: Aug 06, 1925   Cancelled Treatment:    Reason Eval/Treat Not Completed: Fatigue/lethargy limiting ability to participate;Other (comment) (Patient sleeping upon PT attempt, unable to rouse with verbal or tactile cueing. Will attempt again at later time/date.)  Precious Bard, PT, DPT   10/25/2019, 3:36 PM

## 2019-10-25 NOTE — Progress Notes (Signed)
OT Cancellation Note  Patient Details Name: Madiha Bambrick MRN: [id number] DOB: [birth year]   Cancelled Treatment:     Patient with low hemoglobin this date and expected to receive a blood transfusion, BP also low as of 10 am.  Will continue attempts and follow up when patient is medically stable for OT tx sessions.   Divina Neale T Hairo Garraway, OTR/L, CLT   Louay Myrie 10/25/2019, 10:19 AM

## 2019-10-25 NOTE — Consult Note (Signed)
Pharmacy Antibiotic Note  Yvonne Knight is a 86 y.o. female admitted on 10/21/2019 with right hip fracture s/p IM nailing now with fever. Patient also with acute blood loss anemia and has been receiving blood transfusions. Pharmacy has been consulted for cefepime and vancomycin dosing for empiric therapy.   Plan: Cefepime 2 g q12h   Vancomycin LD 1500 mg x 1 followed by maintenance dose of 1000 mg q24h   Daily Scr per protocol. Trough at steady state as indicated.  Height: 5\' 2"  (157.5 cm) Weight: 66.7 kg (147 lb 0.8 oz) IBW/kg (Calculated) : 50.1  Temp (24hrs), Avg:98.8 F (37.1 C), Min:97.5 F (36.4 C), Max:100.3 F (37.9 C)  Recent Labs  Lab 10/21/19 1250 10/21/19 1250 10/22/19 0429 10/23/19 0429 10/23/19 2058 10/24/19 0441 10/25/19 0433  WBC 8.0   < > 7.9 9.2 9.0 8.3 5.7  CREATININE 1.27*  --  1.14* 1.17*  --  0.78 0.85   < > = values in this interval not displayed.    Estimated Creatinine Clearance: 37.8 mL/min (by C-G formula based on SCr of 0.85 mg/dL).    No Known Allergies  Antimicrobials this admission: Patient received surgical prophylaxis with cefazolin / gentamicin  Vancomycin 6/12 >>  Cefepime 6/12 >>  Dose adjustments this admission: n/a  Microbiology results: 6/11 Wound cx: no growth <24h 6/11 BCx: no growth <24h 6/9 MRSA PCR: negative  Thank you for allowing pharmacy to be a part of this patient's care.  8/9  Pharmacy Resident 10/25/2019 1:09 PM

## 2019-10-25 NOTE — Progress Notes (Signed)
PROGRESS NOTE    Yvonne Knight  MWU:[id number] DOB: 1925/12/12 DOA: 10/21/2019 PCP: Barbette Reichmann, MD    Assessment & Plan:   Principal Problem:   Intertrochanteric fracture of right hip (HCC) Active Problems:   HTN (hypertension)   Chronic diastolic CHF (congestive heart failure) (HCC)   Depression   Acute renal failure superimposed on stage 3a chronic kidney disease (HCC)   Fall at home, initial encounter   Rheumatoid arthritis (HCC)    Yvonne Knight is a 86 y.o. Caucasian female with medical history significant of hypertension, hyperlipidemia, depression, dCHF, rheumatoid arthritis, CKD stage III, who presented with mechanical fall, right hip pain.  Intertrochanteric fracture of right hip s/p RIGHT INTRAMEDULLARY (IM) NAIL INTERTROCHANTRIC on 10/23/19 X-ray of pelvis showed comminuted right intertrochanteric to subtrochanteric fracture with lateral angulation. No neurovascular compromise. Orthopedic surgeon, Dr. Athaliah Clan performed the surgery. --serosanguineous drainage from her proximal incisions likely due to pressure from swelling PLAN: - Pain control: percocet PRN  - Robaxin for muscle spasm --PT/OT  --Dressing changed by ortho   Post-op hypotension likely due to anesthesia -blood loss ~150 ml.  Received about 2.5L IVF.  Neo 100 mcg x3 in the PACU.  BP 90's. --due to age, multiple comorbidities, pt was transferred to stepdown for close monitoring and low-dose pressor --MIVF and neo gtt d/c'ed on 6/11 PLAN: --Keep systolic above 90's  --Hold all BP meds.  Post-op fever --spiked fever intermittently.  No signs of infection at surgical site.  Fever did not correlate with blood transfusion.  --blood cx x2 obtained. PLAN: --start empiric vanc/cefepime today --CXR today, no acute finding  Anasarca and volume overload Hx of chronic diastolic CHF --from necessary IVF resuscitation, and diastolic CHF.  2D echo on 07/30/2018 showed EF >55%. --IV lasix 40  on 6/11 didn't seem to produce much urine output PLAN: --Metolazone x1 today f/b IV lasix 40 mg BID --Insert Foley for strict I/O --Monitor O2 sat  Anemia, likely from blood loss  --Hgb 12.7 on presentation and dropped after surgery. --already received 2u pRBC PLAN: --3rd unit today for Hgb dropping to 6.4 again  Depression: -Celexa, olanzapine  Acute renal failure, resolved superimposed on stage 3a chronic kidney disease (HCC):  Baseline Cre is 0.8 on 07/23/19, pt's Cre was 1.27  and BUN  23 on admission. Likely due to dehydration and continuation of ARB, diuretics --MIVF d/c'ed - Hold Cozaar  Fall at home, initial encounter: -pt/ot  Rheumatoid arthritis: -Continue methotrexate   DVT prophylaxis: Lovenox SQ Code Status: Full code  Family Communication:  Status is: inpatient Dispo:   The patient is from: home Anticipated d/c is to: SNF rehab Anticipated d/c date is: unclear when, multiple serious conditions developed post-op Patient currently is not medically stable to d/c due to: post-op fever, drainage from surgical site, fluid overloaded needing diuresis   Subjective and Interval History:  Pt said she "feels fine."  Ate breakfast.  Denied dyspnea.  No chest pain, abdominal pain, N/V/D.  Spiked another fever today.  Started on vanc/cefepime.     Objective: Vitals:   10/25/19 1115 10/25/19 1120 10/25/19 1200 10/25/19 1300  BP: (!) 105/41  (!) 114/39 (!) 115/40  Pulse: 88 90 88 89  Resp: (!) 29 (!) 23 (!) 27 (!) 30  Temp:  100.3 F (37.9 C)    TempSrc:  Axillary    SpO2: 97% 98% 97% 95%  Weight:      Height:        Intake/Output Summary (Last  24 hours) at 10/25/2019 1341 Last data filed at 10/25/2019 1300 Gross per 24 hour  Intake 720 ml  Output 950 ml  Net -230 ml   Filed Weights   10/21/19 1248  Weight: 66.7 kg    Examination:   Constitutional: NAD, oriented, lethargic, ansarca HEENT: conjunctivae and lids normal, EOMI CV: RRR 2+ systolic  murmur at upper sternal boarder. Distal pulses +2.  No cyanosis.   RESP: CTA B/L, normal respiratory effort, on RA  GI: +BS, NTND Extremities: generalized swelling in all extremities.  Surgical dressing over right lateral hip filled with serosanguinous fluids SKIN: warm, dry and intact Neuro: II - XII grossly intact.  Sensation intact   Data Reviewed: I have personally reviewed following labs and imaging studies  CBC: Recent Labs  Lab 10/21/19 1250 10/21/19 1250 10/22/19 0429 10/22/19 0429 10/23/19 0429 10/23/19 0429 10/23/19 1541 10/23/19 2058 10/24/19 0441 10/24/19 1212 10/25/19 0433  WBC 8.0   < > 7.9  --  9.2  --   --  9.0 8.3  --  5.7  NEUTROABS 4.6  --   --   --   --   --   --   --   --   --   --   HGB 12.7   < > 10.5*   < > 7.0*   < > 7.9* 7.7* 6.9* 7.6* 6.4*  HCT 38.9   < > 32.3*   < > 21.5*   < > 23.2* 22.5* 21.5* 23.2* 19.3*  MCV 91.1   < > 91.8  --  91.5  --   --  90.0 93.9  --  90.6  PLT 174   < > 162  --  158  --   --  126* 112*  --  104*   < > = values in this interval not displayed.   Basic Metabolic Panel: Recent Labs  Lab 10/21/19 1250 10/22/19 0429 10/23/19 0429 10/24/19 0441 10/25/19 0433  NA 142 143 142 141 139  K 3.8 3.9 4.1 4.3 4.1  CL 104 107 106 108 107  CO2 30 31 26 29 25   GLUCOSE 125* 175* 137* 108* 110*  BUN 23 26* 28* 26* 36*  CREATININE 1.27* 1.14* 1.17* 0.78 0.85  CALCIUM 8.7* 8.3* 7.6* 7.4* 7.6*  MG  --   --  1.9 1.8 2.0   GFR: Estimated Creatinine Clearance: 37.8 mL/min (by C-G formula based on SCr of 0.85 mg/dL). Liver Function Tests: Recent Labs  Lab 10/21/19 1250  AST 20  ALT 15  ALKPHOS 54  BILITOT 0.6  PROT 6.3*  ALBUMIN 3.7   No results for input(s): LIPASE, AMYLASE in the last 168 hours. No results for input(s): AMMONIA in the last 168 hours. Coagulation Profile: Recent Labs  Lab 10/21/19 2020 10/23/19 2058  INR 1.1 1.3*   Cardiac Enzymes: No results for input(s): CKTOTAL, CKMB, CKMBINDEX, TROPONINI in the  last 168 hours. BNP (last 3 results) No results for input(s): PROBNP in the last 8760 hours. HbA1C: No results for input(s): HGBA1C in the last 72 hours. CBG: No results for input(s): GLUCAP in the last 168 hours. Lipid Profile: No results for input(s): CHOL, HDL, LDLCALC, TRIG, CHOLHDL, LDLDIRECT in the last 72 hours. Thyroid Function Tests: No results for input(s): TSH, T4TOTAL, FREET4, T3FREE, THYROIDAB in the last 72 hours. Anemia Panel: No results for input(s): VITAMINB12, FOLATE, FERRITIN, TIBC, IRON, RETICCTPCT in the last 72 hours. Sepsis Labs: No results for input(s): PROCALCITON, LATICACIDVEN in the  last 168 hours.  Recent Results (from the past 240 hour(s))  SARS Coronavirus 2 by RT PCR (hospital order, performed in [facility] hospital lab) Nasopharyngeal Nasopharyngeal Swab     Status: None   Collection Time: 10/21/19  2:09 PM   Specimen: Nasopharyngeal Swab  Result Value Ref Range Status   SARS Coronavirus 2 NEGATIVE NEGATIVE Final    Comment: (NOTE) SARS-CoV-2 target nucleic acids are NOT DETECTED. The SARS-CoV-2 RNA is generally detectable in upper and lower respiratory specimens during the acute phase of infection. The lowest concentration of SARS-CoV-2 viral copies this assay can detect is 250 copies / mL. A negative result does not preclude SARS-CoV-2 infection and should not be used as the sole basis for treatment or other patient management decisions.  A negative result may occur with improper specimen collection / handling, submission of specimen other than nasopharyngeal swab, presence of viral mutation(s) within the areas targeted by this assay, and inadequate number of viral copies (<250 copies / mL). A negative result must be combined with clinical observations, patient history, and epidemiological information. Fact Sheet for Patients:   BoilerBrush.com.cy Fact Sheet for Healthcare  Providers: https://pope.com/ This test is not yet approved or cleared  by the [place] FDA and has been authorized for detection and/or diagnosis of SARS-CoV-2 by FDA under an Emergency Use Authorization (EUA).  This EUA will remain in effect (meaning this test can be used) for the duration of the COVID-19 declaration under Section 564(b)(1) of the Act, 21 U.S.C. section 360bbb-3(b)(1), unless the authorization is terminated or revoked sooner. Performed at [facility], [address] [place], [place] [address]   MRSA PCR Screening     Status: None   Collection Time: 10/22/19  8:22 PM   Specimen: Nasopharyngeal  Result Value Ref Range Status   MRSA by PCR NEGATIVE NEGATIVE Final    Comment:        The GeneXpert MRSA Assay (FDA approved for NASAL specimens only), is one component of a comprehensive MRSA colonization surveillance program. It is not intended to diagnose MRSA infection nor to guide or monitor treatment for MRSA infections. Performed at [facility], [address] [place], [place] [address]   Culture, blood (Routine X 2) w Reflex to ID Panel     Status: None (Preliminary result)   Collection Time: 10/24/19  1:23 PM   Specimen: BLOOD  Result Value Ref Range Status   Specimen Description BLOOD BLLA  Final   Special Requests   Final    BOTTLES DRAWN AEROBIC AND ANAEROBIC Blood Culture adequate volume   Culture   Final    NO GROWTH < 24 HOURS Performed at [facility], [address], [place], [place] [address]    Report Status PENDING  Incomplete  Culture, blood (Routine X 2) w Reflex to ID Panel     Status: None (Preliminary result)   Collection Time: 10/24/19  1:34 PM   Specimen: BLOOD  Result Value Ref Range Status   Specimen Description BLOOD [facility]  Final   Special Requests   Final    BOTTLES DRAWN AEROBIC AND ANAEROBIC Blood Culture adequate volume   Culture   Final    NO GROWTH < 24  HOURS Performed at [facility], [address] [place], [place] [address]    Report Status PENDING  Incomplete  Aerobic Culture (superficial specimen)     Status: None (Preliminary result)   Collection Time: 10/24/19  2:40 PM  Specimen: Wound  Result Value Ref Range Status   Specimen Description   Final    WOUND Performed at [facility], [address] [place], [place] [address]    Special Requests   Final    NONE Performed at [facility], [place], [place] [address]    Gram Stain NO WBC SEEN NO ORGANISMS SEEN   Final   Culture   Final    NO GROWTH < 24 HOURS Performed at [facility], [place] [address] [place], [place] [address]    Report Status PENDING  Incomplete      Radiology Studies: No results found.   Scheduled Meds: . sodium chloride   Intravenous Once  . sodium chloride   Intravenous Once  . calcium-vitamin D  1 tablet Oral Daily  . Chlorhexidine Gluconate Cloth  6 each Topical Daily  . cholecalciferol  1,000 Units Oral Daily  . citalopram  20 mg Oral Daily  . docusate sodium  100 mg Oral BID  . feeding supplement (ENSURE ENLIVE)  237 mL Oral BID BM  . folic acid  1 mg Oral Daily  . furosemide  40 mg Intravenous Once  . methotrexate  10 mg Oral Weekly  . multivitamin with minerals  1 tablet Oral Daily  . OLANZapine  2.5 mg Oral QHS  . senna  1 tablet Oral BID  . vitamin B-12  1,000 mcg Oral Daily   Continuous Infusions: . ceFEPime (MAXIPIME) IV    . [START ON 10/26/2019] vancomycin    . vancomycin       LOS: 4 days     Enzo Bi, MD [facility] If 7PM-7AM, please contact night-coverage 10/25/2019, 1:41 PM

## 2019-10-25 NOTE — Progress Notes (Signed)
PT Cancellation Note  Patient Details Name: Yvonne Knight MRN: [id number] DOB: 1925/07/26   Cancelled Treatment:    Reason Eval/Treat Not Completed: Fatigue/lethargy limiting ability to participate;Other (comment) (Pt. awake upon PT entering room, attempted supine interventions, patient kept falling asleep mid exercise, session terminated due to patient's inability to participate.)  Precious Bard, PT, DPT   10/25/2019, 4:11 PM

## 2019-10-26 LAB — MAGNESIUM: Magnesium: 1.8 mg/dL (ref 1.7–2.4)

## 2019-10-26 LAB — PROCALCITONIN: Procalcitonin: 0.13 ng/mL

## 2019-10-26 LAB — CBC
HCT: 21.5 % — ABNORMAL LOW (ref 36.0–46.0)
Hemoglobin: 7.4 g/dL — ABNORMAL LOW (ref 12.0–15.0)
MCH: 29.8 pg (ref 26.0–34.0)
MCHC: 34.4 g/dL (ref 30.0–36.0)
MCV: 86.7 fL (ref 80.0–100.0)
Platelets: 129 10*3/uL — ABNORMAL LOW (ref 150–400)
RBC: 2.48 MIL/uL — ABNORMAL LOW (ref 3.87–5.11)
RDW: 17 % — ABNORMAL HIGH (ref 11.5–15.5)
WBC: 6.2 10*3/uL (ref 4.0–10.5)
nRBC: 0 % (ref 0.0–0.2)

## 2019-10-26 LAB — TYPE AND SCREEN
ABO/RH(D): A POS
Antibody Screen: NEGATIVE
Unit division: 0
Unit division: 0

## 2019-10-26 LAB — BPAM RBC
Blood Product Expiration Date: 202107012359
Blood Product Expiration Date: 202107022359
ISSUE DATE / TIME: 202106101039
ISSUE DATE / TIME: 202106110813
Unit Type and Rh: 6200
Unit Type and Rh: 6200

## 2019-10-26 LAB — BASIC METABOLIC PANEL
Anion gap: 8 (ref 5–15)
BUN: 39 mg/dL — ABNORMAL HIGH (ref 8–23)
CO2: 29 mmol/L (ref 22–32)
Calcium: 8 mg/dL — ABNORMAL LOW (ref 8.9–10.3)
Chloride: 100 mmol/L (ref 98–111)
Creatinine, Ser: 0.82 mg/dL (ref 0.44–1.00)
GFR calc Af Amer: >60 mL/min (ref >60)
GFR calc non Af Amer: >60 mL/min (ref >60)
Glucose, Bld: 104 mg/dL — ABNORMAL HIGH (ref 70–99)
Potassium: 3.4 mmol/L — ABNORMAL LOW (ref 3.5–5.1)
Sodium: 137 mmol/L (ref 135–145)

## 2019-10-26 MED ORDER — POTASSIUM CHLORIDE CRYS ER 20 MEQ PO TBCR
40.0000 meq | EXTENDED_RELEASE_TABLET | Freq: Once | ORAL | Status: AC
  Administered 2019-10-26: 40 meq via ORAL
  Filled 2019-10-26: qty 2

## 2019-10-26 MED ORDER — METOLAZONE 5 MG PO TABS
5.0000 mg | ORAL_TABLET | Freq: Once | ORAL | Status: AC
  Administered 2019-10-26: 5 mg via ORAL
  Filled 2019-10-26: qty 1

## 2019-10-26 MED ORDER — ACETAMINOPHEN 500 MG PO TABS
1000.0000 mg | ORAL_TABLET | Freq: Once | ORAL | Status: AC
  Administered 2019-10-26: 1000 mg via ORAL
  Filled 2019-10-26: qty 2

## 2019-10-26 MED ORDER — FUROSEMIDE 10 MG/ML IJ SOLN
40.0000 mg | Freq: Two times a day (BID) | INTRAMUSCULAR | Status: DC
  Administered 2019-10-26 – 2019-10-28 (×4): 40 mg via INTRAVENOUS
  Filled 2019-10-26 (×4): qty 4

## 2019-10-26 NOTE — Progress Notes (Signed)
Physical Therapy Treatment Patient Details Name: Yvonne Knight MRN: [id number] DOB: 07-08-1925 Today's Date: 10/26/2019    History of Present Illness 86 y.o. female with medical history significant for hypertension, hyperlipidemia, depression, dCHF, rheumatoid arthritis, CKD stage III, who presented with mechanical fall, right hip pain. Imaging showed right comminuted four-part intertrochanteric hip fracture with significant displacement. Pt is now S/p R IM fixation. Pt to be NWB in RLE. Pt sent to stepdown postoperatively due to hypotension in the PACU.    PT Comments    Awake and engaged today.  Participated in exercises as described below.  She is able to sit EOB for 4 minutes before fatigue.  Declined attempts at standing or chair today.    Follow Up Recommendations  SNF     Equipment Recommendations       Recommendations for Other Services       Precautions / Restrictions Precautions Precautions: Fall Restrictions Weight Bearing Restrictions: Yes RLE Weight Bearing: Non weight bearing    Mobility  Bed Mobility Overal bed mobility: Needs Assistance Bed Mobility: Supine to Sit;Sit to Supine     Supine to sit: Mod assist;+2 for physical assistance Sit to supine: Mod assist;Max assist;+2 for physical assistance      Transfers                 General transfer comment: deferred  Ambulation/Gait                 Stairs             Wheelchair Mobility    Modified Rankin (Stroke Patients Only)       Balance Overall balance assessment: Needs assistance Sitting-balance support: Bilateral upper extremity supported Sitting balance-Leahy Scale: Fair                                      Cognition Arousal/Alertness: Awake/alert Behavior During Therapy: WFL for tasks assessed/performed Overall Cognitive Status: Within Functional Limits for tasks assessed                                        Exercises  General Exercises - Lower Extremity Ankle Circles/Pumps: AROM;10 reps (lacks DF to neutral on L, calf stretches 5 X 15 sec) Quad Sets: Strengthening;15 reps Short Arc Quad: Strengthening;10 reps Heel Slides: AAROM;10 reps (resisted leg extensions on L) Hip ABduction/ADduction: AROM;AAROM;10 reps Other Exercises Other Exercises: able to sit x 4 minutes with min assist.  Deferred standing due to fatigue    General Comments        Pertinent Vitals/Pain Pain Assessment: Faces Faces Pain Scale: Hurts little more Pain Location: R hip Pain Intervention(s): Limited activity within patient's tolerance;Monitored during session    Home Living                      Prior Function            PT Goals (current goals can now be found in the care plan section) Progress towards PT goals: Progressing toward goals    Frequency    BID      PT Plan Current plan remains appropriate    Co-evaluation              AM-PAC PT "6 Clicks" Mobility   Outcome Measure  Help needed  turning from your back to your side while in a flat bed without using bedrails?: A Lot Help needed moving from lying on your back to sitting on the side of a flat bed without using bedrails?: Total Help needed moving to and from a bed to a chair (including a wheelchair)?: Total Help needed standing up from a chair using your arms (e.g., wheelchair or bedside chair)?: Total Help needed to walk in hospital room?: Total Help needed climbing 3-5 steps with a railing? : Total 6 Click Score: 7    End of Session Equipment Utilized During Treatment: Gait belt Activity Tolerance: Patient tolerated treatment well Patient left: with call bell/phone within reach;in bed Nurse Communication: Mobility status Pain - Right/Left: Right Pain - part of body: Hip     Time: 7096-2836 PT Time Calculation (min) (ACUTE ONLY): 17 min  Charges:  $Therapeutic Exercise: 8-22 mins                     Chesley Noon,  PTA 10/26/19, 12:41 PM

## 2019-10-26 NOTE — Progress Notes (Signed)
Subjective:  Patient reports pain as mild.  She was transferred back to ortho floor.  Doing well.  States she sat up on the side of the bed for a while.  Objective:   VITALS:   Vitals:   10/26/19 0800 10/26/19 0900 10/26/19 1000 10/26/19 1209  BP: 113/69 133/88 (!) 104/39 (!) 131/55  Pulse: 79 85 81 87  Resp: (!) 23 (!) 21 (!) 30 16  Temp: 98.3 F (36.8 C)   98.3 F (36.8 C)  TempSrc: Oral   Oral  SpO2: 96% 94% 97% 96%  Weight:      Height:        PHYSICAL EXAM:  Neurologically intact ABD soft Neurovascular intact Sensation intact distally Intact pulses distally Dorsiflexion/Plantar flexion intact Incision: dressing C/D/I No cellulitis present Compartment soft  LABS  Results for orders placed or performed during the hospital encounter of 10/21/19 (from the past 24 hour(s))  CBC     Status: Abnormal   Collection Time: 10/25/19  2:02 PM  Result Value Ref Range   WBC 6.1 4.0 - 10.5 K/uL   RBC 2.46 (L) 3.87 - 5.11 MIL/uL   Hemoglobin 7.3 (L) 12.0 - 15.0 g/dL   HCT 11.1 (L) 36 - 46 %   MCV 89.0 80.0 - 100.0 fL   MCH 29.7 26.0 - 34.0 pg   MCHC 33.3 30.0 - 36.0 g/dL   RDW 55.2 (H) 08.0 - 22.3 %   Platelets 126 (L) 150 - 400 K/uL   nRBC 0.0 0.0 - 0.2 %  Procalcitonin - Baseline     Status: None   Collection Time: 10/25/19  2:02 PM  Result Value Ref Range   Procalcitonin <0.10 ng/mL  Procalcitonin     Status: None   Collection Time: 10/26/19  6:28 AM  Result Value Ref Range   Procalcitonin 0.13 ng/mL  Basic metabolic panel     Status: Abnormal   Collection Time: 10/26/19  6:28 AM  Result Value Ref Range   Sodium 137 135 - 145 mmol/L   Potassium 3.4 (L) 3.5 - 5.1 mmol/L   Chloride 100 98 - 111 mmol/L   CO2 29 22 - 32 mmol/L   Glucose, Bld 104 (H) 70 - 99 mg/dL   BUN 39 (H) 8 - 23 mg/dL   Creatinine, Ser 3.61 0.44 - 1.00 mg/dL   Calcium 8.0 (L) 8.9 - 10.3 mg/dL   GFR calc non Af Amer >60 >60 mL/min   GFR calc Af Amer >60 >60 mL/min   Anion gap 8 5 - 15   CBC     Status: Abnormal   Collection Time: 10/26/19  6:28 AM  Result Value Ref Range   WBC 6.2 4.0 - 10.5 K/uL   RBC 2.48 (L) 3.87 - 5.11 MIL/uL   Hemoglobin 7.4 (L) 12.0 - 15.0 g/dL   HCT 22.4 (L) 36 - 46 %   MCV 86.7 80.0 - 100.0 fL   MCH 29.8 26.0 - 34.0 pg   MCHC 34.4 30.0 - 36.0 g/dL   RDW 49.7 (H) 53.0 - 05.1 %   Platelets 129 (L) 150 - 400 K/uL   nRBC 0.0 0.0 - 0.2 %  Magnesium     Status: None   Collection Time: 10/26/19  6:28 AM  Result Value Ref Range   Magnesium 1.8 1.7 - 2.4 mg/dL    DG Chest Port 1 View  Result Date: 10/25/2019 CLINICAL DATA:  Fevers EXAM: PORTABLE CHEST 1 VIEW COMPARISON:  10/21/2019 FINDINGS: Cardiac  shadow is stable. Aortic calcifications are again seen. The lungs are well aerated bilaterally. No focal infiltrate is seen. Minimal blunting of left costophrenic angle is noted. Degenerative changes of the shoulder joints and thoracic spine are noted. IMPRESSION: Minimal left basilar effusion.  No other focal abnormality is noted. Electronically Signed   By: Inez Catalina M.D.   On: 10/25/2019 15:12    Assessment/Plan: 4 Days Post-Op   Principal Problem:   Intertrochanteric fracture of right hip (HCC) Active Problems:   HTN (hypertension)   Chronic diastolic CHF (congestive heart failure) (HCC)   Depression   Acute renal failure superimposed on stage 3a chronic kidney disease ([place])   Fall at home, initial encounter   Rheumatoid arthritis ([place])   Advance diet Up with therapy Continue to hold Lovenox. H/H steady.Patient will remain nonweightbearing on the right lower extremity given the comminution of the fracture. She will continue physical therapy as her pain allows. Dressing is intact. A hip compressive dressing is intact.  Continue to follow.SNF when medically stable per medicine.  Carlynn Spry , PA-C 10/26/2019, 12:31 PM

## 2019-10-26 NOTE — Progress Notes (Signed)
PROGRESS NOTE    Yvonne Knight  EGB:[id number] DOB: [birth year] DOA: 10/21/2019 PCP: Barbette Reichmann, MD    Assessment & Plan:   Principal Problem:   Intertrochanteric fracture of right hip (HCC) Active Problems:   HTN (hypertension)   Chronic diastolic CHF (congestive heart failure) (HCC)   Depression   Acute renal failure superimposed on stage 3a chronic kidney disease (HCC)   Fall at home, initial encounter   Rheumatoid arthritis (HCC)    Yvonne Knight is a 86 y.o. Caucasian female with medical history significant of hypertension, hyperlipidemia, depression, dCHF, rheumatoid arthritis, CKD stage III, who presented with mechanical fall, right hip pain.  Intertrochanteric fracture of right hip s/p RIGHT INTRAMEDULLARY (IM) NAIL INTERTROCHANTRIC on 10/23/19 X-ray of pelvis showed comminuted right intertrochanteric to subtrochanteric fracture with lateral angulation. No neurovascular compromise. Orthopedic surgeon, Dr. Nasra Clan performed the surgery. --serosanguineous drainage from her proximal incisions likely due to pressure from swelling PLAN: - Pain control: percocet PRN  - Robaxin for muscle spasm --PT/OT  --Dressing changed by ortho   Post-op hypotension likely due to anesthesia -blood loss ~150 ml.  Received about 2.5L IVF.  Neo 100 mcg x3 in the PACU.  BP 90's. --due to age, multiple comorbidities, pt was transferred to stepdown for close monitoring and low-dose pressor --MIVF and neo gtt d/c'ed on 6/11 PLAN: --Keep systolic above 90's  --Hold all BP meds.  Post-op fever --spiked fever intermittently.  No signs of infection at surgical site.  Fever did not correlate with blood transfusion.  --blood cx x2 obtained. --started empiric vanc/cefepime on 6/12 PLAN: --continue vanc/cefepime for now  Anasarca and volume overload, improved Hx of chronic diastolic CHF --from necessary IVF resuscitation, and diastolic CHF.  2D echo on 07/30/2018 showed EF  >55%. --IV lasix 40 on 6/11 didn't seem to produce much urine output, but urine output greatly improved with addition of metolazone PLAN: --Metolazone x1 today f/b IV lasix 40 mg BID --continue Foley for strict I/O --Monitor O2 sat  Anemia, likely from blood loss  --Hgb 12.7 on presentation and dropped after surgery. --already received 3u pRBC PLAN: --Monitor and transfuse to keep Hgb >7  Depression: -Celexa, olanzapine  Acute renal failure, resolved superimposed on stage 3a chronic kidney disease (HCC):  Baseline Cre is 0.8 on 07/23/19, pt's Cre was 1.27  and BUN  23 on admission. Likely due to dehydration and continuation of ARB, diuretics --MIVF d/c'ed - Hold Cozaar  Fall at home, initial encounter: -pt/ot  Rheumatoid arthritis: -Continue methotrexate   DVT prophylaxis: Lovenox SQ Code Status: Full code  Family Communication:  Status is: inpatient Dispo:   The patient is from: home Anticipated d/c is to: SNF rehab Anticipated d/c date is: 2-3 days Patient currently is not medically stable to d/c due to: post-op fever on empiric IV abx, drainage from surgical site, fluid overloaded needing IV diuresis   Subjective and Interval History:  Pt reported having a headache, otherwise no complaints.  No more fever overnight.  Denied dyspnea.  No N/V/D, dysuria.  Stable for transfer out of stepdown today   Objective: Vitals:   10/26/19 0800 10/26/19 0900 10/26/19 1000 10/26/19 1209  BP: 113/69 133/88 (!) 104/39 (!) 131/55  Pulse: 79 85 81 87  Resp: (!) 23 (!) 21 (!) 30 16  Temp: 98.3 F (36.8 C)   98.3 F (36.8 C)  TempSrc: Oral   Oral  SpO2: 96% 94% 97% 96%  Weight:      Height:  Intake/Output Summary (Last 24 hours) at 10/26/2019 1400 Last data filed at 10/26/2019 1221 Gross per 24 hour  Intake 956.99 ml  Output 2560 ml  Net -1603.01 ml   Filed Weights   10/21/19 1248  Weight: 66.7 kg    Examination:   Constitutional: NAD, oriented,  tired-appearing, ansarca HEENT: conjunctivae and lids normal, EOMI CV: RRR 2+ systolic murmur at upper sternal boarder. Distal pulses +2.  No cyanosis.   RESP: CTA B/L, normal respiratory effort, on RA  GI: +BS, NTND Extremities: generalized swelling in all extremities, improved from yesterday. Surgical dressing over right lateral hip  SKIN: warm, dry and intact Neuro: II - XII grossly intact.  Sensation intact   Data Reviewed: I have personally reviewed following labs and imaging studies  CBC: Recent Labs  Lab 10/21/19 1250 10/22/19 0429 10/23/19 2058 10/23/19 2058 10/24/19 0441 10/24/19 1212 10/25/19 0433 10/25/19 1402 10/26/19 0628  WBC 8.0   < > 9.0  --  8.3  --  5.7 6.1 6.2  NEUTROABS 4.6  --   --   --   --   --   --   --   --   HGB 12.7   < > 7.7*   < > 6.9* 7.6* 6.4* 7.3* 7.4*  HCT 38.9   < > 22.5*   < > 21.5* 23.2* 19.3* 21.9* 21.5*  MCV 91.1   < > 90.0  --  93.9  --  90.6 89.0 86.7  PLT 174   < > 126*  --  112*  --  104* 126* 129*   < > = values in this interval not displayed.   Basic Metabolic Panel: Recent Labs  Lab 10/22/19 0429 10/23/19 0429 10/24/19 0441 10/25/19 0433 10/26/19 0628  NA 143 142 141 139 137  K 3.9 4.1 4.3 4.1 3.4*  CL 107 106 108 107 100  CO2 31 26 29 25 29   GLUCOSE 175* 137* 108* 110* 104*  BUN 26* 28* 26* 36* 39*  CREATININE 1.14* 1.17* 0.78 0.85 0.82  CALCIUM 8.3* 7.6* 7.4* 7.6* 8.0*  MG  --  1.9 1.8 2.0 1.8   GFR: Estimated Creatinine Clearance: 39.2 mL/min (by C-G formula based on SCr of 0.82 mg/dL). Liver Function Tests: Recent Labs  Lab 10/21/19 1250  AST 20  ALT 15  ALKPHOS 54  BILITOT 0.6  PROT 6.3*  ALBUMIN 3.7   No results for input(s): LIPASE, AMYLASE in the last 168 hours. No results for input(s): AMMONIA in the last 168 hours. Coagulation Profile: Recent Labs  Lab 10/21/19 2020 10/23/19 2058  INR 1.1 1.3*   Cardiac Enzymes: No results for input(s): CKTOTAL, CKMB, CKMBINDEX, TROPONINI in the last 168  hours. BNP (last 3 results) No results for input(s): PROBNP in the last 8760 hours. HbA1C: No results for input(s): HGBA1C in the last 72 hours. CBG: No results for input(s): GLUCAP in the last 168 hours. Lipid Profile: No results for input(s): CHOL, HDL, LDLCALC, TRIG, CHOLHDL, LDLDIRECT in the last 72 hours. Thyroid Function Tests: No results for input(s): TSH, T4TOTAL, FREET4, T3FREE, THYROIDAB in the last 72 hours. Anemia Panel: No results for input(s): VITAMINB12, FOLATE, FERRITIN, TIBC, IRON, RETICCTPCT in the last 72 hours. Sepsis Labs: Recent Labs  Lab 10/25/19 1402 10/26/19 0628  PROCALCITON <0.10 0.13    Recent Results (from the past 240 hour(s))  SARS Coronavirus 2 by RT PCR (hospital order, performed in [facility] hospital lab) Nasopharyngeal Nasopharyngeal Swab     Status: None  Collection Time: 10/21/19  2:09 PM   Specimen: Nasopharyngeal Swab  Result Value Ref Range Status   SARS Coronavirus 2 NEGATIVE NEGATIVE Final    Comment: (NOTE) SARS-CoV-2 target nucleic acids are NOT DETECTED. The SARS-CoV-2 RNA is generally detectable in upper and lower respiratory specimens during the acute phase of infection. The lowest concentration of SARS-CoV-2 viral copies this assay can detect is 250 copies / mL. A negative result does not preclude SARS-CoV-2 infection and should not be used as the sole basis for treatment or other patient management decisions.  A negative result may occur with improper specimen collection / handling, submission of specimen other than nasopharyngeal swab, presence of viral mutation(s) within the areas targeted by this assay, and inadequate number of viral copies (<250 copies / mL). A negative result must be combined with clinical observations, patient history, and epidemiological information. Fact Sheet for Patients:   StrictlyIdeas.no Fact Sheet for Healthcare  Providers: BankingDealers.co.za This test is not yet approved or cleared  by the [place] FDA and has been authorized for detection and/or diagnosis of SARS-CoV-2 by FDA under an Emergency Use Authorization (EUA).  This EUA will remain in effect (meaning this test can be used) for the duration of the COVID-19 declaration under Section 564(b)(1) of the Act, 21 U.S.C. section 360bbb-3(b)(1), unless the authorization is terminated or revoked sooner. Performed at [facility], [address] [place], [place] [address]   MRSA PCR Screening     Status: None   Collection Time: 10/22/19  8:22 PM   Specimen: Nasopharyngeal  Result Value Ref Range Status   MRSA by PCR NEGATIVE NEGATIVE Final    Comment:        The GeneXpert MRSA Assay (FDA approved for NASAL specimens only), is one component of a comprehensive MRSA colonization surveillance program. It is not intended to diagnose MRSA infection nor to guide or monitor treatment for MRSA infections. Performed at [facility], [address] [place], [place] [address]   Culture, blood (Routine X 2) w Reflex to ID Panel     Status: None (Preliminary result)   Collection Time: 10/24/19  1:23 PM   Specimen: BLOOD  Result Value Ref Range Status   Specimen Description BLOOD BLLA  Final   Special Requests   Final    BOTTLES DRAWN AEROBIC AND ANAEROBIC Blood Culture adequate volume   Culture   Final    NO GROWTH 2 DAYS Performed at [facility], [address], [place], [place] [address]    Report Status PENDING  Incomplete  Culture, blood (Routine X 2) w Reflex to ID Panel     Status: None (Preliminary result)   Collection Time: 10/24/19  1:34 PM   Specimen: BLOOD  Result Value Ref Range Status   Specimen Description BLOOD [facility]  Final   Special Requests   Final    BOTTLES DRAWN AEROBIC AND ANAEROBIC Blood Culture adequate volume   Culture   Final    NO GROWTH 2 DAYS Performed at  [facility], [address] [place], [place] [address]    Report Status PENDING  Incomplete  Aerobic Culture (superficial specimen)     Status: None (Preliminary result)   Collection Time: 10/24/19  2:40 PM   Specimen: Wound  Result Value Ref Range Status   Specimen Description   Final    WOUND Performed at [facility], [address], [place],  [address]    Special Requests   Final  NONE Performed at [facility], [address] [place], [place] [address]    Gram Stain NO WBC SEEN NO ORGANISMS SEEN   Final   Culture   Final    NO GROWTH 2 DAYS Performed at [facility], [address], [place], [place] [address]    Report Status PENDING  Incomplete      Radiology Studies: DG Chest Port 1 View  Result Date: 10/25/2019 CLINICAL DATA:  Fevers EXAM: PORTABLE CHEST 1 VIEW COMPARISON:  10/21/2019 FINDINGS: Cardiac shadow is stable. Aortic calcifications are again seen. The lungs are well aerated bilaterally. No focal infiltrate is seen. Minimal blunting of left costophrenic angle is noted. Degenerative changes of the shoulder joints and thoracic spine are noted. IMPRESSION: Minimal left basilar effusion.  No other focal abnormality is noted. Electronically Signed   By: Alcide Clever M.D.   On: 10/25/2019 15:12     Scheduled Meds: . calcium-vitamin D  1 tablet Oral Daily  . Chlorhexidine Gluconate Cloth  6 each Topical Daily  . cholecalciferol  1,000 Units Oral Daily  . citalopram  20 mg Oral Daily  . docusate sodium  100 mg Oral BID  . feeding supplement (ENSURE ENLIVE)  237 mL Oral BID BM  . folic acid  1 mg Oral Daily  . furosemide  40 mg Intravenous BID  . methotrexate  10 mg Oral Weekly  . multivitamin with minerals  1 tablet Oral Daily  . OLANZapine  2.5 mg Oral QHS  . senna  1 tablet Oral BID  . vitamin B-12  1,000 mcg Oral Daily   Continuous Infusions: . sodium chloride Stopped (10/26/19 1013)  . ceFEPime (MAXIPIME)  IV Stopped (10/26/19 1044)  . vancomycin       LOS: 5 days     Darlin Priestly, MD [facility] If 7PM-7AM, please contact night-coverage 10/26/2019, 2:00 PM

## 2019-10-27 LAB — AEROBIC CULTURE W GRAM STAIN (SUPERFICIAL SPECIMEN)
Culture: NO GROWTH
Gram Stain: NONE SEEN

## 2019-10-27 LAB — BASIC METABOLIC PANEL
Anion gap: 8 (ref 5–15)
BUN: 37 mg/dL — ABNORMAL HIGH (ref 8–23)
CO2: 30 mmol/L (ref 22–32)
Calcium: 7.9 mg/dL — ABNORMAL LOW (ref 8.9–10.3)
Chloride: 100 mmol/L (ref 98–111)
Creatinine, Ser: 0.9 mg/dL (ref 0.44–1.00)
GFR calc Af Amer: >60 mL/min (ref >60)
GFR calc non Af Amer: 56 mL/min — ABNORMAL LOW (ref >60)
Glucose, Bld: 102 mg/dL — ABNORMAL HIGH (ref 70–99)
Potassium: 3.5 mmol/L (ref 3.5–5.1)
Sodium: 138 mmol/L (ref 135–145)

## 2019-10-27 LAB — MAGNESIUM: Magnesium: 1.9 mg/dL (ref 1.7–2.4)

## 2019-10-27 LAB — PROCALCITONIN: Procalcitonin: <0.1 ng/mL

## 2019-10-27 LAB — CBC
HCT: 21.1 % — ABNORMAL LOW (ref 36.0–46.0)
Hemoglobin: 7.3 g/dL — ABNORMAL LOW (ref 12.0–15.0)
MCH: 30.2 pg (ref 26.0–34.0)
MCHC: 34.6 g/dL (ref 30.0–36.0)
MCV: 87.2 fL (ref 80.0–100.0)
Platelets: 159 10*3/uL (ref 150–400)
RBC: 2.42 MIL/uL — ABNORMAL LOW (ref 3.87–5.11)
RDW: 16.7 % — ABNORMAL HIGH (ref 11.5–15.5)
WBC: 6.3 10*3/uL (ref 4.0–10.5)
nRBC: 0 % (ref 0.0–0.2)

## 2019-10-27 MED ORDER — METOLAZONE 5 MG PO TABS
5.0000 mg | ORAL_TABLET | Freq: Once | ORAL | Status: AC
  Administered 2019-10-27: 5 mg via ORAL
  Filled 2019-10-27: qty 1

## 2019-10-27 MED ORDER — ENOXAPARIN SODIUM 40 MG/0.4ML ~~LOC~~ SOLN
40.0000 mg | SUBCUTANEOUS | Status: DC
  Administered 2019-10-27 – 2019-10-28 (×2): 40 mg via SUBCUTANEOUS
  Filled 2019-10-27 (×2): qty 0.4

## 2019-10-27 NOTE — TOC Progression Note (Signed)
Transition of Care [facility]) - Progression Note    Patient Details  Name: Yvonne Knight MRN: [id number] Date of Birth: 05-05-1926  Transition of Care [facility]) CM/SW Contact  Nick Stults, Gardiner Rhyme, LCSW Phone Number: 10/27/2019, 12:04 PM  Clinical Narrative:  Met with son to discuss plan at discharge. They prefer [facility] in [place] or [place]. Have contacted [place] with [facility] and faxed pt's information to find out if can offer a bed.    Expected Discharge Plan: [place] Barriers to Discharge: Continued Medical Work up  Expected Discharge Plan and Services Expected Discharge Plan: [place] Choice: [place] arrangements for the past 2 months: Single Family Home                                       Social Determinants of Health (SDOH) Interventions    Readmission Risk Interventions Readmission Risk Prevention Plan 10/23/2019  Transportation Screening Complete  PCP or Specialist Appt within 3-5 Days Complete  HRI or Home Care Consult Complete  Medication Review (RN Care Manager) Complete  Some recent data might be hidden

## 2019-10-27 NOTE — Progress Notes (Signed)
Physical Therapy Treatment Patient Details Name: Yvonne Knight MRN: [id number] DOB: 1926-04-29 Today's Date: 10/27/2019    History of Present Illness 86 y.o. female with medical history significant for hypertension, hyperlipidemia, depression, dCHF, rheumatoid arthritis, CKD stage III, who presented with mechanical fall, right hip pain. Imaging showed right comminuted four-part intertrochanteric hip fracture with significant displacement. Pt is now S/p R IM fixation. Pt to be NWB in RLE. Pt sent to stepdown postoperatively due to hypotension in the PACU.    PT Comments    Participated in exercises as described below.  To EOB to sit x 5 minutes.  Unable to lateral scoot without dependant assist.  Fatigues after 5 minutes.  Deferred OOB due to inability to maintain NWB with standing.  Would benefit from sliding transfer to mechanical lift for OOB for pt and staff safety.   Follow Up Recommendations  SNF     Equipment Recommendations       Recommendations for Other Services       Precautions / Restrictions Precautions Precautions: Fall Restrictions Weight Bearing Restrictions: Yes RLE Weight Bearing: Non weight bearing    Mobility  Bed Mobility Overal bed mobility: Needs Assistance Bed Mobility: Supine to Sit;Sit to Supine     Supine to sit: Mod assist Sit to supine: Max assist      Transfers                 General transfer comment: deferred  Ambulation/Gait                 Stairs             Wheelchair Mobility    Modified Rankin (Stroke Patients Only)       Balance Overall balance assessment: Needs assistance Sitting-balance support: Bilateral upper extremity supported Sitting balance-Leahy Scale: Fair Sitting balance - Comments: Steady static sitting at EOB x 5 minutes before fatigue       Standing balance comment: deferred  due to inability to maintain NWB                            Cognition Arousal/Alertness:  Awake/alert Behavior During Therapy: WFL for tasks assessed/performed Overall Cognitive Status: Within Functional Limits for tasks assessed                                        Exercises General Exercises - Lower Extremity Ankle Circles/Pumps: AROM;10 reps (lacks DF to neutral on L, calf stretches 5 X 15 sec) Quad Sets: Strengthening;15 reps Short Arc Quad: Strengthening;10 reps Heel Slides: AAROM;10 reps (resisted leg extensions on L) Hip ABduction/ADduction: AROM;AAROM;10 reps Other Exercises Other Exercises: sat 5 min EOB    General Comments        Pertinent Vitals/Pain Pain Assessment: Faces Faces Pain Scale: Hurts little more Pain Location: R hip Pain Descriptors / Indicators: Grimacing;Guarding Pain Intervention(s): Limited activity within patient's tolerance;Monitored during session;Repositioned    Home Living                      Prior Function            PT Goals (current goals can now be found in the care plan section) Progress towards PT goals: Progressing toward goals    Frequency    BID      PT Plan Current plan remains  appropriate    Co-evaluation              AM-PAC PT "6 Clicks" Mobility   Outcome Measure  Help needed turning from your back to your side while in a flat bed without using bedrails?: A Lot Help needed moving from lying on your back to sitting on the side of a flat bed without using bedrails?: Total Help needed moving to and from a bed to a chair (including a wheelchair)?: Total Help needed standing up from a chair using your arms (e.g., wheelchair or bedside chair)?: Total Help needed to walk in hospital room?: Total Help needed climbing 3-5 steps with a railing? : Total 6 Click Score: 7    End of Session   Activity Tolerance: Patient tolerated treatment well Patient left: with call bell/phone within reach;in bed Nurse Communication: Mobility status Pain - Right/Left: Right Pain - part of  body: Hip     Time: 7591-6384 PT Time Calculation (min) (ACUTE ONLY): 24 min  Charges:  $Therapeutic Exercise: 8-22 mins $Therapeutic Activity: 8-22 mins                    Chesley Noon, PTA 10/27/19, 10:14 AM

## 2019-10-27 NOTE — Progress Notes (Signed)
Subjective:  POD #5 s/p intramedullary fixation for right intertrochanteric hip fracture..   Patient reports right hip pain as mild.  Patient's dressing was reinforced over the weekend for persistent drainage.  Wound culture and blood cultures were taken for fever over the weekend and all cultures are negative to date.  Patient seen in her room today with her son at the bedside.  Patient currently is comfortable.  Objective:   VITALS:   Vitals:   10/26/19 1610 10/26/19 2025 10/26/19 2358 10/27/19 0735  BP: (!) 109/42 (!) 135/56 (!) 123/50 (!) 128/52  Pulse: 81 84 82 77  Resp: (!) 21 16 16 20   Temp: 98.1 F (36.7 C) 98.3 F (36.8 C) 98.2 F (36.8 C) 98.1 F (36.7 C)  TempSrc: Oral Oral Oral Oral  SpO2: 97% 93% 92% 93%  Weight:      Height:        PHYSICAL EXAM: Right lower extremity: Compressive dressing over the right hip remains clean dry and intact. Neurovascular intact Sensation intact distally Intact pulses distally Dorsiflexion/Plantar flexion intact Incision: dressing C/D/I No cellulitis present Compartment soft  LABS  Results for orders placed or performed during the hospital encounter of 10/21/19 (from the past 24 hour(s))  Procalcitonin     Status: None   Collection Time: 10/27/19  5:13 AM  Result Value Ref Range   Procalcitonin <0.10 ng/mL  Basic metabolic panel     Status: Abnormal   Collection Time: 10/27/19  5:13 AM  Result Value Ref Range   Sodium 138 135 - 145 mmol/L   Potassium 3.5 3.5 - 5.1 mmol/L   Chloride 100 98 - 111 mmol/L   CO2 30 22 - 32 mmol/L   Glucose, Bld 102 (H) 70 - 99 mg/dL   BUN 37 (H) 8 - 23 mg/dL   Creatinine, Ser 0.90 0.44 - 1.00 mg/dL   Calcium 7.9 (L) 8.9 - 10.3 mg/dL   GFR calc non Af Amer 56 (L) >60 mL/min   GFR calc Af Amer >60 >60 mL/min   Anion gap 8 5 - 15  CBC     Status: Abnormal   Collection Time: 10/27/19  5:13 AM  Result Value Ref Range   WBC 6.3 4.0 - 10.5 K/uL   RBC 2.42 (L) 3.87 - 5.11 MIL/uL   Hemoglobin  7.3 (L) 12.0 - 15.0 g/dL   HCT 21.1 (L) 36 - 46 %   MCV 87.2 80.0 - 100.0 fL   MCH 30.2 26.0 - 34.0 pg   MCHC 34.6 30.0 - 36.0 g/dL   RDW 16.7 (H) 11.5 - 15.5 %   Platelets 159 150 - 400 K/uL   nRBC 0.0 0.0 - 0.2 %  Magnesium     Status: None   Collection Time: 10/27/19  5:13 AM  Result Value Ref Range   Magnesium 1.9 1.7 - 2.4 mg/dL    No results found.  Assessment/Plan: 5 Days Post-Op   Principal Problem:   Intertrochanteric fracture of right hip (HCC) Active Problems:   HTN (hypertension)   Chronic diastolic CHF (congestive heart failure) (HCC)   Depression   Acute renal failure superimposed on stage 3a chronic kidney disease ([facility])   Fall at home, initial encounter   Rheumatoid arthritis [facility])  Patient improving from an orthopedic standpoint.  Hemoglobin remained stable.  Continue to reinforce dressing as needed.  Hold on Lovenox until drainage from incision stops.  Enteric-coated aspirin 325 mg p.o. twice daily would be an alternative for the patient  for DVT prophylaxis if medically appropriate.  Continue with physical therapy.  Patient may be discharged to skilled nursing facility from an orthopedic standpoint once cleared medically.  Patient will follow-up in my office in approximate 10 to 14 days.    Juanell Fairly , MD 10/27/2019, 1:15 PM

## 2019-10-27 NOTE — Progress Notes (Signed)
  Shift Summary:  Patient had no acute events overnight.  VSS and urinary output adequate.Hemoglobin 7.3 this morning. Foley remains in place, with some leakage.  Polar ice, foots pumps, wound vac, and ted hoses remain in place.  Dressing to right leg dry and intact. Ace wrap applied to midsection and RLE clean, dry and, intact.  Patient SR on monitor.

## 2019-10-27 NOTE — Plan of Care (Signed)

## 2019-10-27 NOTE — Progress Notes (Addendum)
PROGRESS NOTE    Yvonne Knight  DGL:[id number] DOB: 1925/08/19 DOA: 10/21/2019 PCP: Barbette Reichmann, MD    Assessment & Plan:   Principal Problem:   Intertrochanteric fracture of right hip (HCC) Active Problems:   HTN (hypertension)   Chronic diastolic CHF (congestive heart failure) (HCC)   Depression   Acute renal failure superimposed on stage 3a chronic kidney disease (HCC)   Fall at home, initial encounter   Rheumatoid arthritis (HCC)    Yvonne Knight is a 86 y.o. Caucasian female with medical history significant of hypertension, hyperlipidemia, depression, dCHF, rheumatoid arthritis, CKD stage III, who presented with mechanical fall, right hip pain.  Intertrochanteric fracture of right hip s/p RIGHT INTRAMEDULLARY (IM) NAIL INTERTROCHANTRIC on 10/23/19 X-ray of pelvis showed comminuted right intertrochanteric to subtrochanteric fracture with lateral angulation. No neurovascular compromise. Orthopedic surgeon, Dr. Mckenzy Clan performed the surgery. --serosanguineous drainage from her proximal incisions likely due to pressure from swelling, now stopped after reinforced dressing and decrease in swelling. PLAN: - Pain control: percocet PRN  - Robaxin for muscle spasm --PT/OT  --Dressing change by ortho  --resume DVT ppx with Lovenox now that drainage from incision had stopped. --follow-up in ortho clinic in approximate 10 to 14 days.  Post-op hypotension likely due to anesthesia -blood loss ~150 ml.  Received about 2.5L IVF.  Neo 100 mcg x3 in the PACU.  BP 90's. --due to age, multiple comorbidities, pt was transferred to stepdown for close monitoring and low-dose pressor --MIVF and neo gtt d/c'ed on 6/11 PLAN: --Keep systolic above 90's  --Hold all BP meds.  Post-op fever, resolved --spiked fever intermittently.  No signs of infection at surgical site.  Fever did not correlate with blood transfusion.  --blood cx x2 and drainage from incision cultured, all neg  growth so far.  Procal remained neg. --started empiric vanc/cefepime on 6/12 PLAN: --will d/c vanc/cefepime after 3 days since no infectious source found.  Anasarca and volume overload, improved Hx of chronic diastolic CHF --from necessary IVF resuscitation, and diastolic CHF.  2D echo on 07/30/2018 showed EF >55%. --IV lasix 40 on 6/11 didn't seem to produce much urine output, but urine output greatly improved with addition of metolazone PLAN: --Metolazone x1 today f/b IV lasix 40 mg BID --continue Foley for strict I/O, can remove tomorrow --Monitor O2 sat  Anemia, likely from blood loss  --Hgb 12.7 on presentation and dropped after surgery. --already received 3u pRBC PLAN: --Monitor and transfuse to keep Hgb >7  Depression: -Celexa, olanzapine  Acute renal failure, resolved superimposed on stage 3a chronic kidney disease (HCC):  Baseline Cre is 0.8 on 07/23/19, pt's Cre was 1.27  and BUN  23 on admission. Likely due to dehydration and continuation of ARB, diuretics --MIVF d/c'ed - Hold Cozaar  Fall at home, initial encounter: -pt/ot  Rheumatoid arthritis: -Continue methotrexate   DVT prophylaxis: Lovenox SQ Code Status: Full code  Family Communication: Son updated at bedside today Status is: inpatient Dispo:   The patient is from: home Anticipated d/c is to: SNF rehab Anticipated d/c date is: 10/29/19 Patient currently is not medically stable to d/c due to: Likely need 1 more day of IV diuresis.   Subjective and Interval History:  Pt reported doing well, and had sat at side of bed multiple times.  No more fever.  No dyspnea, chest pain, abdominal pain, N/V/D.    Objective: Vitals:   10/26/19 2025 10/26/19 2358 10/27/19 0735 10/27/19 1530  BP: (!) 135/56 (!) 123/50 (!) 128/52 Marland Kitchen)  117/44  Pulse: 84 82 77 81  Resp: 16 16 20 16   Temp: 98.3 F (36.8 C) 98.2 F (36.8 C) 98.1 F (36.7 C) 99.6 F (37.6 C)  TempSrc: Oral Oral Oral Oral  SpO2: 93% 92% 93% 93%    Weight:      Height:        Intake/Output Summary (Last 24 hours) at 10/27/2019 1809 Last data filed at 10/27/2019 1748 Gross per 24 hour  Intake 334.25 ml  Output 2300 ml  Net -1965.75 ml   Filed Weights   10/21/19 1248  Weight: 66.7 kg    Examination:   Constitutional: NAD, oriented, brighter today HEENT: conjunctivae and lids normal, EOMI CV: RRR 2+ systolic murmur at upper sternal boarder. Distal pulses +2.  No cyanosis.   RESP: CTA B/L, normal respiratory effort, on RA  GI: +BS, NTND Extremities: generalized swelling in all extremities, improved from yesterday. Surgical dressing over right lateral hip  SKIN: warm, dry and intact Neuro: II - XII grossly intact.  Sensation intact   Data Reviewed: I have personally reviewed following labs and imaging studies  CBC: Recent Labs  Lab 10/21/19 1250 10/22/19 0429 10/24/19 0441 10/24/19 0441 10/24/19 1212 10/25/19 0433 10/25/19 1402 10/26/19 0628 10/27/19 0513  WBC 8.0   < > 8.3  --   --  5.7 6.1 6.2 6.3  NEUTROABS 4.6  --   --   --   --   --   --   --   --   HGB 12.7   < > 6.9*   < > 7.6* 6.4* 7.3* 7.4* 7.3*  HCT 38.9   < > 21.5*   < > 23.2* 19.3* 21.9* 21.5* 21.1*  MCV 91.1   < > 93.9  --   --  90.6 89.0 86.7 87.2  PLT 174   < > 112*  --   --  104* 126* 129* 159   < > = values in this interval not displayed.   Basic Metabolic Panel: Recent Labs  Lab 10/23/19 0429 10/24/19 0441 10/25/19 0433 10/26/19 0628 10/27/19 0513  NA 142 141 139 137 138  K 4.1 4.3 4.1 3.4* 3.5  CL 106 108 107 100 100  CO2 26 29 25 29 30   GLUCOSE 137* 108* 110* 104* 102*  BUN 28* 26* 36* 39* 37*  CREATININE 1.17* 0.78 0.85 0.82 0.90  CALCIUM 7.6* 7.4* 7.6* 8.0* 7.9*  MG 1.9 1.8 2.0 1.8 1.9   GFR: Estimated Creatinine Clearance: 35.7 mL/min (by C-G formula based on SCr of 0.9 mg/dL). Liver Function Tests: Recent Labs  Lab 10/21/19 1250  AST 20  ALT 15  ALKPHOS 54  BILITOT 0.6  PROT 6.3*  ALBUMIN 3.7   No results for  input(s): LIPASE, AMYLASE in the last 168 hours. No results for input(s): AMMONIA in the last 168 hours. Coagulation Profile: Recent Labs  Lab 10/21/19 2020 10/23/19 2058  INR 1.1 1.3*   Cardiac Enzymes: No results for input(s): CKTOTAL, CKMB, CKMBINDEX, TROPONINI in the last 168 hours. BNP (last 3 results) No results for input(s): PROBNP in the last 8760 hours. HbA1C: No results for input(s): HGBA1C in the last 72 hours. CBG: No results for input(s): GLUCAP in the last 168 hours. Lipid Profile: No results for input(s): CHOL, HDL, LDLCALC, TRIG, CHOLHDL, LDLDIRECT in the last 72 hours. Thyroid Function Tests: No results for input(s): TSH, T4TOTAL, FREET4, T3FREE, THYROIDAB in the last 72 hours. Anemia Panel: No results for input(s): VITAMINB12, FOLATE,  FERRITIN, TIBC, IRON, RETICCTPCT in the last 72 hours. Sepsis Labs: Recent Labs  Lab 10/25/19 1402 10/26/19 0628 10/27/19 0513  PROCALCITON <0.10 0.13 <0.10    Recent Results (from the past 240 hour(s))  SARS Coronavirus 2 by RT PCR (hospital order, performed in [facility] hospital lab) Nasopharyngeal Nasopharyngeal Swab     Status: None   Collection Time: 10/21/19  2:09 PM   Specimen: Nasopharyngeal Swab  Result Value Ref Range Status   SARS Coronavirus 2 NEGATIVE NEGATIVE Final    Comment: (NOTE) SARS-CoV-2 target nucleic acids are NOT DETECTED. The SARS-CoV-2 RNA is generally detectable in upper and lower respiratory specimens during the acute phase of infection. The lowest concentration of SARS-CoV-2 viral copies this assay can detect is 250 copies / mL. A negative result does not preclude SARS-CoV-2 infection and should not be used as the sole basis for treatment or other patient management decisions.  A negative result may occur with improper specimen collection / handling, submission of specimen other than nasopharyngeal swab, presence of viral mutation(s) within the areas targeted by this assay, and inadequate  number of viral copies (<250 copies / mL). A negative result must be combined with clinical observations, patient history, and epidemiological information. Fact Sheet for Patients:   BoilerBrush.com.cy Fact Sheet for Healthcare Providers: https://pope.com/ This test is not yet approved or cleared  by the [place] FDA and has been authorized for detection and/or diagnosis of SARS-CoV-2 by FDA under an Emergency Use Authorization (EUA).  This EUA will remain in effect (meaning this test can be used) for the duration of the COVID-19 declaration under Section 564(b)(1) of the Act, 21 U.S.C. section 360bbb-3(b)(1), unless the authorization is terminated or revoked sooner. Performed at [facility], [address] [place], [place] [address]   MRSA PCR Screening     Status: None   Collection Time: 10/22/19  8:22 PM   Specimen: Nasopharyngeal  Result Value Ref Range Status   MRSA by PCR NEGATIVE NEGATIVE Final    Comment:        The GeneXpert MRSA Assay (FDA approved for NASAL specimens only), is one component of a comprehensive MRSA colonization surveillance program. It is not intended to diagnose MRSA infection nor to guide or monitor treatment for MRSA infections. Performed at [facility], [address] [place], [place] [address]   Culture, blood (Routine X 2) w Reflex to ID Panel     Status: None (Preliminary result)   Collection Time: 10/24/19  1:23 PM   Specimen: BLOOD  Result Value Ref Range Status   Specimen Description BLOOD BLLA  Final   Special Requests   Final    BOTTLES DRAWN AEROBIC AND ANAEROBIC Blood Culture adequate volume   Culture   Final    NO GROWTH 3 DAYS Performed at [facility], [address] [place], [place] [address]    Report Status PENDING  Incomplete  Culture, blood (Routine X 2) w Reflex to ID Panel     Status: None (Preliminary result)   Collection Time:  10/24/19  1:34 PM   Specimen: BLOOD  Result Value Ref Range Status   Specimen Description BLOOD [facility]  Final   Special Requests   Final    BOTTLES DRAWN AEROBIC AND ANAEROBIC Blood Culture adequate volume   Culture   Final    NO GROWTH 3 DAYS Performed at [facility], [address], [place], [place] [address]    Report Status PENDING  Incomplete  Aerobic Culture (superficial specimen)     Status: None   Collection Time: 10/24/19  2:40 PM   Specimen: Wound  Result Value Ref Range Status   Specimen Description   Final    WOUND Performed at [facility], [address], [place], [place] [address]    Special Requests   Final    NONE Performed at [facility], [address] [place], [place] [address]    Gram Stain NO WBC SEEN NO ORGANISMS SEEN   Final   Culture   Final    NO GROWTH Performed at [facility], [place] [address] [place], [place] [address]    Report Status 10/27/2019 FINAL  Final      Radiology Studies: No results found.   Scheduled Meds: . calcium-vitamin D  1 tablet Oral Daily  . Chlorhexidine Gluconate Cloth  6 each Topical Daily  . cholecalciferol  1,000 Units Oral Daily  . citalopram  20 mg Oral Daily  . docusate sodium  100 mg Oral BID  . feeding supplement (ENSURE ENLIVE)  237 mL Oral BID BM  . folic acid  1 mg Oral Daily  . furosemide  40 mg Intravenous BID  . methotrexate  10 mg Oral Weekly  . multivitamin with minerals  1 tablet Oral Daily  . OLANZapine  2.5 mg Oral QHS  . senna  1 tablet Oral BID  . vitamin B-12  1,000 mcg Oral Daily   Continuous Infusions: . sodium chloride Stopped (10/26/19 1834)  . ceFEPime (MAXIPIME) IV 2 g (10/27/19 0940)  . vancomycin 1,000 mg (10/27/19 1657)     LOS: 6 days     Enzo Bi, MD [facility] If 7PM-7AM, please contact night-coverage 10/27/2019, 6:09 PM

## 2019-10-27 NOTE — TOC Progression Note (Signed)
Transition of Care [facility]) - Progression Note    Patient Details  Name: Yvonne Knight MRN: [id number] Date of Birth: 23-Mar-1926  Transition of Care [facility]) CM/SW Contact  Ryley Bachtel, Lemar Livings, LCSW Phone Number: 10/27/2019, 1:32 PM  Clinical Narrative:   Spoke with [facility] who has received the information faxed and have offered a bed for Wed. Have informed son and pt along with medical staff will need new COVID test tomorrow and will need to have  BM in the last three days. Son pleased with this plan.     Expected Discharge Plan: Skilled Nursing Facility Barriers to Discharge: Continued Medical Work up  Expected Discharge Plan and Services Expected Discharge Plan: Skilled Nursing Facility     Post Acute Care Choice: Skilled Nursing Facility Living arrangements for the past 2 months: Single Family Home                                       Social Determinants of Health (SDOH) Interventions    Readmission Risk Interventions Readmission Risk Prevention Plan 10/23/2019  Transportation Screening Complete  PCP or Specialist Appt within 3-5 Days Complete  HRI or Home Care Consult Complete  Medication Review (RN Care Manager) Complete  Some recent data might be hidden

## 2019-10-27 NOTE — Progress Notes (Signed)
Physical Therapy Treatment Patient Details Name: Yvonne Knight MRN: [id number] DOB: 02-25-1926 Today's Date: 10/27/2019    History of Present Illness 86 y.o. female with medical history significant for hypertension, hyperlipidemia, depression, dCHF, rheumatoid arthritis, CKD stage III, who presented with mechanical fall, right hip pain. Imaging showed right comminuted four-part intertrochanteric hip fracture with significant displacement. Pt is now S/p R IM fixation. Pt to be NWB in RLE. Pt sent to stepdown postoperatively due to hypotension in the PACU.    PT Comments    Ready and motivated for session.  To EOB with mod/max a x 1.  Pt with improved sitting balance this session and able to maintain balance without cues.  She is able to increase sitting time to 8 minutes before fatigue and requesting to lay back down with max a x 1.    Pt continues with global weakness and inability to maintain WB with standing attempts.  Lateral scooting along EOB attempted this AM and remains dependant.  Pt appropriate for lift tranfers for OOB needs at this time fot pt and staff safety and to maintain NWB status.  She remains motivated to participate but limited by fatigue/weakness.   Follow Up Recommendations  SNF     Equipment Recommendations       Recommendations for Other Services       Precautions / Restrictions Precautions Precautions: Fall Restrictions Weight Bearing Restrictions: Yes RLE Weight Bearing: Non weight bearing    Mobility  Bed Mobility Overal bed mobility: Needs Assistance Bed Mobility: Supine to Sit;Sit to Supine     Supine to sit: Mod assist Sit to supine: Max assist      Transfers                 General transfer comment: deferred  Ambulation/Gait                 Stairs             Wheelchair Mobility    Modified Rankin (Stroke Patients Only)       Balance Overall balance assessment: Needs assistance Sitting-balance  support: Bilateral upper extremity supported Sitting balance-Leahy Scale: Fair Sitting balance - Comments: Steady static sitting at EOB x 8 minutes before fatigue   Standing balance support: Bilateral upper extremity supported Standing balance-Leahy Scale: Zero Standing balance comment: deferred  due to inability to maintain NWB                            Cognition Arousal/Alertness: Awake/alert Behavior During Therapy: WFL for tasks assessed/performed Overall Cognitive Status: Within Functional Limits for tasks assessed                                        Exercises Other Exercises Other Exercises: sat 8 min EOB    General Comments        Pertinent Vitals/Pain Pain Assessment: Faces Faces Pain Scale: Hurts little more Pain Location: R hip Pain Descriptors / Indicators: Grimacing;Guarding Pain Intervention(s): Limited activity within patient's tolerance;Monitored during session;Repositioned    Home Living                      Prior Function            PT Goals (current goals can now be found in the care plan section) Progress towards PT goals:  Progressing toward goals    Frequency    BID      PT Plan Current plan remains appropriate    Co-evaluation              AM-PAC PT "6 Clicks" Mobility   Outcome Measure  Help needed turning from your back to your side while in a flat bed without using bedrails?: A Lot Help needed moving from lying on your back to sitting on the side of a flat bed without using bedrails?: Total Help needed moving to and from a bed to a chair (including a wheelchair)?: Total Help needed standing up from a chair using your arms (e.g., wheelchair or bedside chair)?: Total Help needed to walk in hospital room?: Total Help needed climbing 3-5 steps with a railing? : Total 6 Click Score: 7    End of Session   Activity Tolerance: Patient tolerated treatment well Patient left: with call  bell/phone within reach;in bed Nurse Communication: Mobility status Pain - Right/Left: Right Pain - part of body: Hip     Time: 8069-9967 PT Time Calculation (min) (ACUTE ONLY): 15 min  Charges:  $Therapeutic Activity: 8-22 mins                     Danielle Dess, PTA 10/27/19, 2:15 PM

## 2019-10-27 NOTE — TOC Progression Note (Signed)
Transition of Care [facility]) - Progression Note    Patient Details  Name: Yvonne Knight MRN: [id number] Date of Birth: [birth year]  Transition of Care [facility]) CM/SW Contact  Tachina Spoonemore, Lemar Livings, LCSW Phone Number: 10/27/2019, 9:08 AM  Clinical Narrative:   Have left message for [facility] to discuss if will have a bed for pt mid-week. Awaiting return call to discuss if able to make a bed offer    Expected Discharge Plan: Skilled Nursing Facility Barriers to Discharge: Continued Medical Work up  Expected Discharge Plan and Services Expected Discharge Plan: Skilled Nursing Facility     Post Acute Care Choice: Skilled Nursing Facility Living arrangements for the past 2 months: Single Family Home                                       Social Determinants of Health (SDOH) Interventions    Readmission Risk Interventions Readmission Risk Prevention Plan 10/23/2019  Transportation Screening Complete  PCP or Specialist Appt within 3-5 Days Complete  HRI or Home Care Consult Complete  Medication Review (RN Care Manager) Complete  Some recent data might be hidden

## 2019-10-28 DIAGNOSIS — Y92009 Unspecified place in unspecified non-institutional (private) residence as the place of occurrence of the external cause: Secondary | ICD-10-CM

## 2019-10-28 DIAGNOSIS — W19XXXA Unspecified fall, initial encounter: Secondary | ICD-10-CM

## 2019-10-28 DIAGNOSIS — I10 Essential (primary) hypertension: Secondary | ICD-10-CM

## 2019-10-28 DIAGNOSIS — M069 Rheumatoid arthritis, unspecified: Secondary | ICD-10-CM

## 2019-10-28 DIAGNOSIS — S72001A Fracture of unspecified part of neck of right femur, initial encounter for closed fracture: Secondary | ICD-10-CM

## 2019-10-28 DIAGNOSIS — I5032 Chronic diastolic (congestive) heart failure: Secondary | ICD-10-CM

## 2019-10-28 LAB — TYPE AND SCREEN
ABO/RH(D): A POS
Antibody Screen: NEGATIVE
Unit division: 0
Unit division: 0

## 2019-10-28 LAB — BASIC METABOLIC PANEL
Anion gap: 11 (ref 5–15)
BUN: 33 mg/dL — ABNORMAL HIGH (ref 8–23)
CO2: 30 mmol/L (ref 22–32)
Calcium: 8 mg/dL — ABNORMAL LOW (ref 8.9–10.3)
Chloride: 96 mmol/L — ABNORMAL LOW (ref 98–111)
Creatinine, Ser: 0.73 mg/dL (ref 0.44–1.00)
GFR calc Af Amer: >60 mL/min (ref >60)
GFR calc non Af Amer: >60 mL/min (ref >60)
Glucose, Bld: 106 mg/dL — ABNORMAL HIGH (ref 70–99)
Potassium: 3.2 mmol/L — ABNORMAL LOW (ref 3.5–5.1)
Sodium: 137 mmol/L (ref 135–145)

## 2019-10-28 LAB — CBC
HCT: 22.1 % — ABNORMAL LOW (ref 36.0–46.0)
Hemoglobin: 7.6 g/dL — ABNORMAL LOW (ref 12.0–15.0)
MCH: 30.2 pg (ref 26.0–34.0)
MCHC: 34.4 g/dL (ref 30.0–36.0)
MCV: 87.7 fL (ref 80.0–100.0)
Platelets: 182 10*3/uL (ref 150–400)
RBC: 2.52 MIL/uL — ABNORMAL LOW (ref 3.87–5.11)
RDW: 16.4 % — ABNORMAL HIGH (ref 11.5–15.5)
WBC: 6.9 10*3/uL (ref 4.0–10.5)
nRBC: 0.6 % — ABNORMAL HIGH (ref 0.0–0.2)

## 2019-10-28 LAB — IRON AND TIBC
Iron: 35 ug/dL (ref 28–170)
Saturation Ratios: 16 % (ref 10.4–31.8)
TIBC: 214 ug/dL — ABNORMAL LOW (ref 250–450)
UIBC: 179 ug/dL

## 2019-10-28 LAB — FOLATE: Folate: 24 ng/mL (ref >5.9)

## 2019-10-28 LAB — BPAM RBC
Blood Product Expiration Date: 202107062359
Blood Product Expiration Date: 202107062359
ISSUE DATE / TIME: 202106121107
Unit Type and Rh: 6200
Unit Type and Rh: 6200

## 2019-10-28 LAB — RETICULOCYTES
Immature Retic Fract: 29.7 % — ABNORMAL HIGH (ref 2.3–15.9)
RBC.: 2.39 MIL/uL — ABNORMAL LOW (ref 3.87–5.11)
Retic Count, Absolute: 69.1 10*3/uL (ref 19.0–186.0)
Retic Ct Pct: 2.9 % (ref 0.4–3.1)

## 2019-10-28 LAB — PREPARE RBC (CROSSMATCH)

## 2019-10-28 LAB — SARS CORONAVIRUS 2 (TAT 6-24 HRS): SARS Coronavirus 2: NEGATIVE

## 2019-10-28 LAB — MAGNESIUM: Magnesium: 1.8 mg/dL (ref 1.7–2.4)

## 2019-10-28 LAB — FERRITIN: Ferritin: 218 ng/mL (ref 11–307)

## 2019-10-28 LAB — VITAMIN B12: Vitamin B-12: 1747 pg/mL — ABNORMAL HIGH (ref 180–914)

## 2019-10-28 MED ORDER — FUROSEMIDE 40 MG PO TABS
40.0000 mg | ORAL_TABLET | Freq: Every day | ORAL | Status: DC
  Administered 2019-10-29: 40 mg via ORAL
  Filled 2019-10-28: qty 1

## 2019-10-28 MED ORDER — POTASSIUM CHLORIDE CRYS ER 20 MEQ PO TBCR
40.0000 meq | EXTENDED_RELEASE_TABLET | Freq: Once | ORAL | Status: AC
  Administered 2019-10-28: 40 meq via ORAL
  Filled 2019-10-28: qty 2

## 2019-10-28 NOTE — Care Management Important Message (Signed)
Important Message  Patient Details  Name: Yvonne Knight MRN: [id number] Date of Birth: [birth year]   Medicare Important Message Given:  Yes     [name] A Rashidi Loh 10/28/2019, 11:21 AM

## 2019-10-28 NOTE — Progress Notes (Signed)
Physical Therapy Treatment Patient Details Name: Yvonne Knight MRN: [id number] DOB: 09-Oct-1925 Today's Date: 10/28/2019    History of Present Illness 86 y.o. female with medical history significant for hypertension, hyperlipidemia, depression, dCHF, rheumatoid arthritis, CKD stage III, who presented with mechanical fall, right hip pain. Imaging showed right comminuted four-part intertrochanteric hip fracture with significant displacement. Pt is now S/p R IM fixation. Pt to be NWB in RLE. Pt sent to stepdown postoperatively due to hypotension in the PACU.    PT Comments    Pt was supine in bed with HOB elevated ~ 20 degrees. She is alert throughout session but does require increased time to respond and process. BP in supine 121/46. Required increased time and mod assist to sit L side EOB. Sat EOB x several minutes performing exercises prior to c/o dizziness. BP recheck: 93/38. Therapist assisted pt back to supine with max assist required. After several minutes in supine and performing exercises in bed BP: 108/66. Pt will benefit form SNF at DC to address strength, balance, and safe functional mobility deficits.     Follow Up Recommendations  SNF     Equipment Recommendations  Other (comment) (defer to next level of care)    Recommendations for Other Services       Precautions / Restrictions Precautions Precautions: Fall Precaution Comments: High Fall Restrictions Weight Bearing Restrictions: Yes RLE Weight Bearing: Non weight bearing    Mobility  Bed Mobility Overal bed mobility: Needs Assistance Bed Mobility: Supine to Sit;Sit to Supine     Supine to sit: Mod assist Sit to supine: Max assist      Transfers                    Ambulation/Gait                 Stairs             Wheelchair Mobility    Modified Rankin (Stroke Patients Only)       Balance Overall balance assessment: Needs assistance Sitting-balance support: Bilateral  upper extremity supported Sitting balance-Leahy Scale: Fair Sitting balance - Comments: pt sat EOB x ~ 8 minutes prior to c/o dizziness and return to supine       Standing balance comment: deferred  due to inability to maintain NWB                            Cognition Arousal/Alertness: Awake/alert Behavior During Therapy: WFL for tasks assessed/performed Overall Cognitive Status: Difficult to assess                                 General Comments: Pt was alert throughout. she could follow commands consistently. did require several occasions of repeating desired task prior to responding      Exercises General Exercises - Lower Extremity Ankle Circles/Pumps: AROM;10 reps Quad Sets: Strengthening;15 reps Long Arc Quad: AROM;10 reps;Seated    General Comments        Pertinent Vitals/Pain Pain Assessment: Faces Faces Pain Scale: Hurts little more Pain Location: R hip Pain Descriptors / Indicators: Grimacing;Guarding Pain Intervention(s): Limited activity within patient's tolerance;Monitored during session;Premedicated before session;Repositioned    Home Living                      Prior Function  PT Goals (current goals can now be found in the care plan section) Acute Rehab PT Goals Patient Stated Goal: To get stronger Progress towards PT goals: Progressing toward goals    Frequency    BID      PT Plan Current plan remains appropriate    Co-evaluation     PT goals addressed during session: Mobility/safety with mobility;Balance;Proper use of DME;Strengthening/ROM        AM-PAC PT "6 Clicks" Mobility   Outcome Measure  Help needed turning from your back to your side while in a flat bed without using bedrails?: A Lot Help needed moving from lying on your back to sitting on the side of a flat bed without using bedrails?: A Lot Help needed moving to and from a bed to a chair (including a wheelchair)?: Total Help  needed standing up from a chair using your arms (e.g., wheelchair or bedside chair)?: Total Help needed to walk in hospital room?: Total Help needed climbing 3-5 steps with a railing? : Total 6 Click Score: 8    End of Session Equipment Utilized During Treatment: Gait belt Activity Tolerance: Patient tolerated treatment well Patient left: in bed;with call bell/phone within reach;with bed alarm set;with SCD's reapplied Nurse Communication: Mobility status PT Visit Diagnosis: Muscle weakness (generalized) (M62.81);Difficulty in walking, not elsewhere classified (R26.2);Pain Pain - Right/Left: Right Pain - part of body: Hip     Time: 0240-9735 PT Time Calculation (min) (ACUTE ONLY): 29 min  Charges:  $Therapeutic Exercise: 8-22 mins $Therapeutic Activity: 8-22 mins                     Jetta Lout PTA 10/28/19, 1:24 PM

## 2019-10-28 NOTE — Progress Notes (Signed)
Subjective:  POD #6 s/p IM fixation for right intertrochanteric hip fracture.   Patient reports right hip pain as mild at rest.   Patient has increased pain in the right hip with movement.  I spoke with the patient's physical therapist who states that she was able to sit at the bedside.  He explains that the patient was fatigued during physical therapy.  Objective:   VITALS:   Vitals:   10/27/19 2347 10/28/19 0720 10/28/19 0723 10/28/19 1503  BP: (!) 109/43 (!) 124/43 (!) 123/48 (!) 115/37  Pulse: 73 75 76 82  Resp: 19 (!) 22  (!) 24  Temp: 97.9 F (36.6 C) 98 F (36.7 C) 98.5 F (36.9 C) 98.1 F (36.7 C)  TempSrc: Oral Oral Oral Oral  SpO2: 91% 94% 93% 90%  Weight:      Height:        PHYSICAL EXAM: Right lower extremity Neurovascular intact Sensation intact distally Intact pulses distally Dorsiflexion/Plantar flexion intact Incision: dressing C/D/I No cellulitis present Compartment soft  LABS  Results for orders placed or performed during the hospital encounter of 10/21/19 (from the past 24 hour(s))  Basic metabolic panel     Status: Abnormal   Collection Time: 10/28/19  5:35 AM  Result Value Ref Range   Sodium 137 135 - 145 mmol/L   Potassium 3.2 (L) 3.5 - 5.1 mmol/L   Chloride 96 (L) 98 - 111 mmol/L   CO2 30 22 - 32 mmol/L   Glucose, Bld 106 (H) 70 - 99 mg/dL   BUN 33 (H) 8 - 23 mg/dL   Creatinine, Ser 3.26 0.44 - 1.00 mg/dL   Calcium 8.0 (L) 8.9 - 10.3 mg/dL   GFR calc non Af Amer >60 >60 mL/min   GFR calc Af Amer >60 >60 mL/min   Anion gap 11 5 - 15  CBC     Status: Abnormal   Collection Time: 10/28/19  5:35 AM  Result Value Ref Range   WBC 6.9 4.0 - 10.5 K/uL   RBC 2.52 (L) 3.87 - 5.11 MIL/uL   Hemoglobin 7.6 (L) 12.0 - 15.0 g/dL   HCT 71.2 (L) 36 - 46 %   MCV 87.7 80.0 - 100.0 fL   MCH 30.2 26.0 - 34.0 pg   MCHC 34.4 30.0 - 36.0 g/dL   RDW 45.8 (H) 09.9 - 83.3 %   Platelets 182 150 - 400 K/uL   nRBC 0.6 (H) 0.0 - 0.2 %  Magnesium     Status:  None   Collection Time: 10/28/19  5:35 AM  Result Value Ref Range   Magnesium 1.8 1.7 - 2.4 mg/dL  Vitamin A25     Status: Abnormal   Collection Time: 10/28/19  8:27 AM  Result Value Ref Range   Vitamin B-12 1,747 (H) 180 - 914 pg/mL  Folate     Status: None   Collection Time: 10/28/19  8:27 AM  Result Value Ref Range   Folate 24.0 >5.9 ng/mL  Iron and TIBC     Status: Abnormal   Collection Time: 10/28/19  8:27 AM  Result Value Ref Range   Iron 35 28 - 170 ug/dL   TIBC 053 (L) 976 - 734 ug/dL   Saturation Ratios 16 10.4 - 31.8 %   UIBC 179 ug/dL  Ferritin     Status: None   Collection Time: 10/28/19  8:27 AM  Result Value Ref Range   Ferritin 218 11 - 307 ng/mL  Reticulocytes  Status: Abnormal   Collection Time: 10/28/19  8:27 AM  Result Value Ref Range   Retic Ct Pct 2.9 0.4 - 3.1 %   RBC. 2.39 (L) 3.87 - 5.11 MIL/uL   Retic Count, Absolute 69.1 19.0 - 186.0 K/uL   Immature Retic Fract 29.7 (H) 2.3 - 15.9 %    No results found.  Assessment/Plan: 6 Days Post-Op   Principal Problem:   Intertrochanteric fracture of right hip (HCC) Active Problems:   HTN (hypertension)   Chronic diastolic CHF (congestive heart failure) (HCC)   Depression   Acute renal failure superimposed on stage 3a chronic kidney disease ([place])   Fall at home, initial encounter   Rheumatoid arthritis ([place])   Closed right hip fracture, initial encounter [facility])  Patient is stable.  Patient making progress from orthopedic standpoint.  Patient awaiting transfer to a skilled nursing facility likely tomorrow.  Patient will remain nonweightbearing on the right lower extremity until follow-up in my office in approximately 2 weeks.  Patient may be discharged on enteric-coated aspirin 325 mg p.o. twice daily or Lovenox 40 mg daily for DVT prophylaxis until follow-up in the office.   Thornton Park , MD 10/28/2019, 4:05 PM

## 2019-10-28 NOTE — Plan of Care (Signed)

## 2019-10-28 NOTE — Progress Notes (Signed)
Physical Therapy Treatment Patient Details Name: Yvonne Knight MRN: [id number] DOB: 09-27-1925 Today's Date: 10/28/2019    History of Present Illness 86 y.o. female with medical history significant for hypertension, hyperlipidemia, depression, dCHF, rheumatoid arthritis, CKD stage III, who presented with mechanical fall, right hip pain. Imaging showed right comminuted four-part intertrochanteric hip fracture with significant displacement. Pt is now S/p R IM fixation. Pt to be NWB in RLE. Pt sent to stepdown postoperatively due to hypotension in the PACU.    PT Comments    Pt was long sitting in bed upon arriving. She agrees to PT session but reports very fatigue requesting not to get OOB. She did agree to performing there ex in bed. Issued handout and pt performed. See exercises below. Pt was repositioned in bed at conclusion of session. She will need continued PT at SNF to address deficits with strength and safe functional mobility. Overall pt tolerated PT well today. Will need extensive strengthening prior to transfer training. Pt unsafe to trial/perform 2/2 to wt bearing restrictions.     Follow Up Recommendations  SNF     Equipment Recommendations  Other (comment) (defer to next level of care)    Recommendations for Other Services       Precautions / Restrictions Precautions Precautions: Fall Precaution Comments: High Fall Restrictions Weight Bearing Restrictions: Yes RLE Weight Bearing: Non weight bearing    Mobility  Bed Mobility Overal bed mobility: Needs Assistance             General bed mobility comments: pt reports being too fatigue requesting to perform ther ex only  Transfers                    Ambulation/Gait                 Stairs             Wheelchair Mobility    Modified Rankin (Stroke Patients Only)       Balance Overall balance assessment: Needs assistance                                           Cognition Arousal/Alertness: Awake/alert Behavior During Therapy: WFL for tasks assessed/performed Overall Cognitive Status: Within Functional Limits for tasks assessed                                 General Comments: pt is HOH was able to follow commands but increased time to process. Pt has cognition deficits      Exercises General Exercises - Lower Extremity Ankle Circles/Pumps: AROM;10 reps Quad Sets: Strengthening;15 reps Gluteal Sets: AROM;10 reps Hip ABduction/ADduction: AAROM;10 reps Straight Leg Raises: AAROM;10 reps Other Exercises Other Exercises: Pt and caregiver educated re: d/c recs, importance of self-feeding for imporved functional strengthening Other Exercises: Bed mobility, face washing, hair brushing, don B SCD, BLE positioning for comfort     General Comments        Pertinent Vitals/Pain Pain Assessment: No/denies pain Pain Score: 0-No pain Faces Pain Scale: Hurts a little bit Pain Location: R hip Pain Descriptors / Indicators: Grimacing;Guarding Pain Intervention(s): Limited activity within patient's tolerance    Home Living                      Prior Function  PT Goals (current goals can now be found in the care plan section) Acute Rehab PT Goals Patient Stated Goal: To get stronger Progress towards PT goals: Progressing toward goals    Frequency    BID      PT Plan Current plan remains appropriate    Co-evaluation     PT goals addressed during session: Strengthening/ROM        AM-PAC PT "6 Clicks" Mobility   Outcome Measure  Help needed turning from your back to your side while in a flat bed without using bedrails?: A Lot Help needed moving from lying on your back to sitting on the side of a flat bed without using bedrails?: A Lot Help needed moving to and from a bed to a chair (including a wheelchair)?: Total Help needed standing up from a chair using your arms (e.g., wheelchair or  bedside chair)?: Total Help needed to walk in hospital room?: Total Help needed climbing 3-5 steps with a railing? : Total 6 Click Score: 8    End of Session Equipment Utilized During Treatment: Gait belt Activity Tolerance: Patient tolerated treatment well Patient left: in bed;with call bell/phone within reach;with bed alarm set;with SCD's reapplied Nurse Communication: Mobility status PT Visit Diagnosis: Muscle weakness (generalized) (M62.81);Difficulty in walking, not elsewhere classified (R26.2);Pain Pain - Right/Left: Right Pain - part of body: Hip     Time: 1425-1435 PT Time Calculation (min) (ACUTE ONLY): 10 min  Charges:  $Therapeutic Exercise: 8-22 mins                     Julaine Fusi PTA 10/28/19, 2:56 PM

## 2019-10-28 NOTE — TOC Progression Note (Signed)
Transition of Care [facility]) - Progression Note    Patient Details  Name: Yvonne Knight MRN: [id number] Date of Birth: 07-Feb-1926  [facility]) CM/SW Contact  Ricci Paff, Lemar Livings, LCSW Phone Number: 10/28/2019, 10:15 AM  Clinical Narrative:  Have spoken with [facility] to let know confirmed for transfer tomorrow. Have contacted [facility] to set up transportation for 12:00 pick up. Will call son to confirm plan for tomorrow. MD and bedside RN aware of plan.     Expected Discharge Plan: Skilled Nursing Facility Barriers to Discharge: Continued Medical Work up  Expected Discharge Plan and Services Expected Discharge Plan: Skilled Nursing Facility     Post Acute Care Choice: Skilled Nursing Facility Living arrangements for the past 2 months: Single Family Home                                       Social Determinants of Health (SDOH) Interventions    Readmission Risk Interventions Readmission Risk Prevention Plan 10/23/2019  Transportation Screening Complete  PCP or Specialist Appt within 3-5 Days Complete  HRI or Home Care Consult Complete  Medication Review (RN Care Manager) Complete  Some recent data might be hidden

## 2019-10-28 NOTE — Progress Notes (Signed)
PROGRESS NOTE    Yvonne Knight  SHF:[id number] DOB: 12/30/1925 DOA: 10/21/2019 PCP: Barbette Reichmann, MD   Brief Narrative:  Yvonne Knight a 86 y.o.Caucasian femalewith medical history significant ofhypertension, hyperlipidemia, depression,dCHF,rheumatoid arthritis, CKD stage III, who presented with mechanical fall, right hip pain. Had right intertrochanteric fracture s/p right intramedullary nail placement on 10/23/2019.  Subjective: Denies any new complaints.  Pain is well controlled.  She did not had any bowel movement for the past couple of days.  Assessment & Plan:   Principal Problem:   Intertrochanteric fracture of right hip (HCC) Active Problems:   HTN (hypertension)   Chronic diastolic CHF (congestive heart failure) (HCC)   Depression   Acute renal failure superimposed on stage 3a chronic kidney disease (HCC)   Fall at home, initial encounter   Rheumatoid arthritis (HCC)  Intertrochanteric fracture of right hip s/p RIGHT INTRAMEDULLARY (IM) NAIL INTERTROCHANTRIC on 10/23/19 X-ray ofpelvis showed comminuted right intertrochanteric to subtrochanteric fracture with lateral angulation.No neurovascular compromise. Orthopedic surgeon, Dr.Krasinskiperformed the surgery. --serosanguineous drainage from her proximal incisions likely due to pressure from swelling, now stopped after reinforced dressing and decrease in swelling. -Continue with pain management. -Patient will be discharged to SNF tomorrow. -She will follow-up with orthopedic in 10 to 14 days.  Post-op hypotension likely due to anesthesia.  Solved. -blood loss ~150 ml.  Received about 2.5L IVF.  Neo 100 mcg x3 in the PACU.  BP 90's. --due to age, multiple comorbidities, pt was transferred to stepdown for close monitoring and low-dose pressor --MIVF and neo gtt d/c'ed on 6/11. -Continue to monitor and keep holding antihypertensives.  Post-op fever, resolved --spiked fever intermittently.  No signs  of infection at surgical site.  Fever did not correlate with blood transfusion.  --blood cx x2 and drainage from incision cultured, all neg growth so far.  Procal remained neg. --started empiric vanc/cefepime on 6/12 -Discontinue antibiotics  Anasarca and volume overload, improved Hx of chronic diastolic CHF --from necessary IVF resuscitation, and diastolic CHF.  2D echo on 07/30/2018 showed EF>55%. --IV lasix 40 on 6/11 didn't seem to produce much urine output, but urine output greatly improved with addition of metolazone. -Discontinue Foley catheter which was placed for strict ins and out. -Continue metolazone and IV Lasix for today-can be discharged on p.o.  Anemia, likely from blood loss  --Hgb 12.7 on presentation and dropped after surgery. --already received 3u pRBC. Hemoglobin seems stable around 7.6 today. -Check anemia panel-more consistent with anemia of chronic disease. -Transfuse if below 7.  Acute renal failure, resolved superimposed on stage 3a chronic kidney disease (HCC): Baseline Cre is0.8 on 07/23/19, pt's Cre was1.27 and BUN 23 on admission. Likely due todehydration and continuation of ARB, diuretics. -Continue to monitor. -Keep holding home dose of Cozaar-can be restarted if blood pressure started going up.  Rheumatoid arthritis: -Continue methotrexate.   Objective: Vitals:   10/27/19 2347 10/28/19 0720 10/28/19 0723 10/28/19 1503  BP: (!) 109/43 (!) 124/43 (!) 123/48 (!) 115/37  Pulse: 73 75 76 82  Resp: 19 (!) 22  (!) 24  Temp: 97.9 F (36.6 C) 98 F (36.7 C) 98.5 F (36.9 C) 98.1 F (36.7 C)  TempSrc: Oral Oral Oral Oral  SpO2: 91% 94% 93% 90%  Weight:      Height:        Intake/Output Summary (Last 24 hours) at 10/28/2019 1526 Last data filed at 10/28/2019 1346 Gross per 24 hour  Intake 360 ml  Output 1900 ml  Net -1540  ml   Filed Weights   10/21/19 1248  Weight: 66.7 kg    Examination:  General exam: Pleasant elderly lady,  appears calm and comfortable  Respiratory system: Clear to auscultation. Respiratory effort normal. Cardiovascular system: S1 & S2 heard, RRR. No JVD, murmurs, rubs, Gastrointestinal system: Soft, nontender, nondistended, bowel sounds positive. Central nervous system: Alert and oriented. No focal neurological deficits. Extremities: No edema, no cyanosis, pulses intact and symmetrical. Psychiatry: Judgement and insight appear normal.  DVT prophylaxis: Lovenox Code Status: Full Family Communication: Discussed with patient Disposition Plan:  Status is: Inpatient  Remains inpatient appropriate because:Inpatient level of care appropriate due to severity of illness   Dispo: The patient is from: Home              Anticipated d/c is to: SNF              Anticipated d/c date is: 1 day              Patient currently is medically stable to d/c.  Patient has bed from tomorrow.  Consultants:   Orthopedic  Procedures:  Antimicrobials:   Data Reviewed: I have personally reviewed following labs and imaging studies  CBC: Recent Labs  Lab 10/25/19 0433 10/25/19 1402 10/26/19 0628 10/27/19 0513 10/28/19 0535  WBC 5.7 6.1 6.2 6.3 6.9  HGB 6.4* 7.3* 7.4* 7.3* 7.6*  HCT 19.3* 21.9* 21.5* 21.1* 22.1*  MCV 90.6 89.0 86.7 87.2 87.7  PLT 104* 126* 129* 159 053   Basic Metabolic Panel: Recent Labs  Lab 10/24/19 0441 10/25/19 0433 10/26/19 0628 10/27/19 0513 10/28/19 0535  NA 141 139 137 138 137  K 4.3 4.1 3.4* 3.5 3.2*  CL 108 107 100 100 96*  CO2 29 25 29 30 30   GLUCOSE 108* 110* 104* 102* 106*  BUN 26* 36* 39* 37* 33*  CREATININE 0.78 0.85 0.82 0.90 0.73  CALCIUM 7.4* 7.6* 8.0* 7.9* 8.0*  MG 1.8 2.0 1.8 1.9 1.8   GFR: Estimated Creatinine Clearance: 40.2 mL/min (by C-G formula based on SCr of 0.73 mg/dL). Liver Function Tests: No results for input(s): AST, ALT, ALKPHOS, BILITOT, PROT, ALBUMIN in the last 168 hours. No results for input(s): LIPASE, AMYLASE in the last 168  hours. No results for input(s): AMMONIA in the last 168 hours. Coagulation Profile: Recent Labs  Lab 10/21/19 2020 10/23/19 2058  INR 1.1 1.3*   Cardiac Enzymes: No results for input(s): CKTOTAL, CKMB, CKMBINDEX, TROPONINI in the last 168 hours. BNP (last 3 results) No results for input(s): PROBNP in the last 8760 hours. HbA1C: No results for input(s): HGBA1C in the last 72 hours. CBG: No results for input(s): GLUCAP in the last 168 hours. Lipid Profile: No results for input(s): CHOL, HDL, LDLCALC, TRIG, CHOLHDL, LDLDIRECT in the last 72 hours. Thyroid Function Tests: No results for input(s): TSH, T4TOTAL, FREET4, T3FREE, THYROIDAB in the last 72 hours. Anemia Panel: Recent Labs    10/28/19 0827  VITAMINB12 1,747*  FOLATE 24.0  FERRITIN 218  TIBC 214*  IRON 35  RETICCTPCT 2.9   Sepsis Labs: Recent Labs  Lab 10/25/19 1402 10/26/19 0628 10/27/19 0513  PROCALCITON <0.10 0.13 <0.10    Recent Results (from the past 240 hour(s))  SARS Coronavirus 2 by RT PCR (hospital order, performed in [facility] hospital lab) Nasopharyngeal Nasopharyngeal Swab     Status: None   Collection Time: 10/21/19  2:09 PM   Specimen: Nasopharyngeal Swab  Result Value Ref Range Status   SARS Coronavirus  2 NEGATIVE NEGATIVE Final    Comment: (NOTE) SARS-CoV-2 target nucleic acids are NOT DETECTED. The SARS-CoV-2 RNA is generally detectable in upper and lower respiratory specimens during the acute phase of infection. The lowest concentration of SARS-CoV-2 viral copies this assay can detect is 250 copies / mL. A negative result does not preclude SARS-CoV-2 infection and should not be used as the sole basis for treatment or other patient management decisions.  A negative result may occur with improper specimen collection / handling, submission of specimen other than nasopharyngeal swab, presence of viral mutation(s) within the areas targeted by this assay, and inadequate number of viral  copies (<250 copies / mL). A negative result must be combined with clinical observations, patient history, and epidemiological information. Fact Sheet for Patients:   BoilerBrush.com.cy Fact Sheet for Healthcare Providers: https://pope.com/ This test is not yet approved or cleared  by the [place] FDA and has been authorized for detection and/or diagnosis of SARS-CoV-2 by FDA under an Emergency Use Authorization (EUA).  This EUA will remain in effect (meaning this test can be used) for the duration of the COVID-19 declaration under Section 564(b)(1) of the Act, 21 U.S.C. section 360bbb-3(b)(1), unless the authorization is terminated or revoked sooner. Performed at [facility], [address] [place], [place] [address]   MRSA PCR Screening     Status: None   Collection Time: 10/22/19  8:22 PM   Specimen: Nasopharyngeal  Result Value Ref Range Status   MRSA by PCR NEGATIVE NEGATIVE Final    Comment:        The GeneXpert MRSA Assay (FDA approved for NASAL specimens only), is one component of a comprehensive MRSA colonization surveillance program. It is not intended to diagnose MRSA infection nor to guide or monitor treatment for MRSA infections. Performed at [facility], [address] [place], [place] [address]   Culture, blood (Routine X 2) w Reflex to ID Panel     Status: None (Preliminary result)   Collection Time: 10/24/19  1:23 PM   Specimen: BLOOD  Result Value Ref Range Status   Specimen Description BLOOD BLLA  Final   Special Requests   Final    BOTTLES DRAWN AEROBIC AND ANAEROBIC Blood Culture adequate volume   Culture   Final    NO GROWTH 4 DAYS Performed at [facility], [address], [place], [place] [address]    Report Status PENDING  Incomplete  Culture, blood (Routine X 2) w Reflex to ID Panel     Status: None (Preliminary result)   Collection Time: 10/24/19  1:34 PM    Specimen: BLOOD  Result Value Ref Range Status   Specimen Description BLOOD [facility]  Final   Special Requests   Final    BOTTLES DRAWN AEROBIC AND ANAEROBIC Blood Culture adequate volume   Culture   Final    NO GROWTH 4 DAYS Performed at [facility], [address] [place], [place] [address]    Report Status PENDING  Incomplete  Aerobic Culture (superficial specimen)     Status: None   Collection Time: 10/24/19  2:40 PM   Specimen: Wound  Result Value Ref Range Status   Specimen Description   Final    WOUND Performed at [facility], [address], [place], [place] [address]    Special Requests   Final    NONE Performed at [facility], [address] [place], [place] [address]    Gram Stain NO WBC  SEEN NO ORGANISMS SEEN   Final   Culture   Final    NO GROWTH Performed at [facility], [address] [place], [place] [address]    Report Status 10/27/2019 FINAL  Final     Radiology Studies: No results found.  Scheduled Meds: . calcium-vitamin D  1 tablet Oral Daily  . Chlorhexidine Gluconate Cloth  6 each Topical Daily  . cholecalciferol  1,000 Units Oral Daily  . citalopram  20 mg Oral Daily  . docusate sodium  100 mg Oral BID  . enoxaparin (LOVENOX) injection  40 mg Subcutaneous Q24H  . feeding supplement (ENSURE ENLIVE)  237 mL Oral BID BM  . folic acid  1 mg Oral Daily  . furosemide  40 mg Intravenous BID  . methotrexate  10 mg Oral Weekly  . multivitamin with minerals  1 tablet Oral Daily  . OLANZapine  2.5 mg Oral QHS  . senna  1 tablet Oral BID  . vitamin B-12  1,000 mcg Oral Daily   Continuous Infusions: . sodium chloride Stopped (10/26/19 1834)     LOS: 7 days   Time spent: 40 minutes.  Arnetha Courser, MD [facility]  If 7PM-7AM, please contact night-coverage [email or web address]  10/28/2019, 3:26 PM   This record has been created using Conservation officer, historic buildings. Errors have been sought and  corrected,but may not always be located. Such creation errors do not reflect on the standard of care.

## 2019-10-28 NOTE — Progress Notes (Signed)
Occupational Therapy Treatment Patient Details Name: Yvonne Knight MRN: [id number] DOB: 09-15-1925 Today's Date: 10/28/2019    History of present illness 86 y.o. female with medical history significant for hypertension, hyperlipidemia, depression, dCHF, rheumatoid arthritis, CKD stage III, who presented with mechanical fall, right hip pain. Imaging showed right comminuted four-part intertrochanteric hip fracture with significant displacement. Pt is now S/p R IM fixation. Pt to be NWB in RLE. Pt sent to stepdown postoperatively due to hypotension in the PACU.   OT comments  Ms Donahoe was seen for OT treatment on this date. Upon arrival to room pt was awake finishing lunch c son at bedside. Pt agreeable to bed level ADL session 2/2 PT reports pt orthostatic hypotensive this date. Pt requires SETUP face washing and hair brushing at bed level - MIN A to adjust torso to upright posture in bed c HoB ~50* and pillow behind back. MAX A to don B SCDs at bed level - RLE elevated on 2 pillows for comfort. BP stable t/o session c HoB ~50* elevated (BP 119/43, MAP 65, HR 82, SpO2 95% on RA). Pt not safe to attempt further ADL transfers this date. Pt and caregiver verbalized understanding of instruction provided. Pt making good progress toward goals. Pt continues to benefit from skilled OT services to maximize return to PLOF and minimize risk of future falls, injury, caregiver burden, and readmission. Will continue to follow POC. Discharge recommendation remains appropriate.    Follow Up Recommendations  SNF;Supervision - Intermittent    Equipment Recommendations  3 in 1 bedside commode    Recommendations for Other Services      Precautions / Restrictions Precautions Precautions: Fall Precaution Comments: High Fall Restrictions Weight Bearing Restrictions: Yes RLE Weight Bearing: Non weight bearing       Mobility Bed Mobility Overal bed mobility: Needs Assistance Bed Mobility: Supine to Sit;Sit  to Supine     Supine to sit: Mod assist Sit to supine: Max assist   General bed mobility comments: MIN A + L bed rail to adjust torso in bed. Further bed mobility deferred 2/2 low BP and symptomatic hypotension c PT this date  Transfers                      Balance Overall balance assessment: Needs assistance         Standing balance comment: deferred  due to inability to maintain NWB                           ADL either performed or assessed with clinical judgement   ADL Overall ADL's : Needs assistance/impaired                                       General ADL Comments: SETUP face washing and hair brushing at bed level - MIN A to adjust torso to upright posture in bed c HoB ~50* and pillow behind back. MAX A to don B SCDs at bed level - RLE elevated on 2 pillows for comfort     Vision       Perception     Praxis      Cognition Arousal/Alertness: Awake/alert Behavior During Therapy: WFL for tasks assessed/performed Overall Cognitive Status: Difficult to assess  General Comments: Pt following commands t/o, pt HoH        Exercises Exercises: Other exercises General Exercises - Lower Extremity Ankle Circles/Pumps: AROM;10 reps Quad Sets: Strengthening;15 reps Long Arc Quad: AROM;10 reps;Seated Other Exercises Other Exercises: Pt and caregiver educated re: d/c recs, importance of self-feeding for imporved functional strengthening Other Exercises: Bed mobility, face washing, hair brushing, don B SCD, BLE positioning for comfort    Shoulder Instructions       General Comments      Pertinent Vitals/ Pain       Pain Assessment: Faces Faces Pain Scale: Hurts a little bit Pain Location: R hip Pain Descriptors / Indicators: Grimacing;Guarding Pain Intervention(s): Limited activity within patient's tolerance;Repositioned  Home Living                                           Prior Functioning/Environment              Frequency  Min 1X/week        Progress Toward Goals  OT Goals(current goals can now be found in the care plan section)  Progress towards OT goals: Progressing toward goals  Acute Rehab OT Goals Patient Stated Goal: To get stronger OT Goal Formulation: With patient Time For Goal Achievement: 11/06/19 Potential to Achieve Goals: Good ADL Goals Pt Will Perform Lower Body Dressing: sit to/from stand;with min guard assist;with min assist;with adaptive equipment ( c LRAD PRN for improved safety and fxl independence upon dc) Pt Will Transfer to Toilet: with min assist;stand pivot transfer;bedside commode ( c LRAD PRN for improved safety and fxl independence upon dc) Pt Will Perform Toileting - Clothing Manipulation and hygiene: sit to/from stand;with mod assist;with adaptive equipment ( c LRAD PRN for improved safety and fxl independence upon dc) Additional ADL Goal #1: Pt will independently verbalize a plan to implement at least 3 learned falls prevention strategies into her daily routines/home environment for improved safety and fxl independence upon hospital DC.  Plan Discharge plan remains appropriate;Frequency remains appropriate    Co-evaluation        PT goals addressed during session: Mobility/safety with mobility;Balance;Proper use of DME;Strengthening/ROM        AM-PAC OT "6 Clicks" Daily Activity     Outcome Measure   Help from another person eating meals?: A Little Help from another person taking care of personal grooming?: A Little Help from another person toileting, which includes using toliet, bedpan, or urinal?: A Lot Help from another person bathing (including washing, rinsing, drying)?: A Lot Help from another person to put on and taking off regular upper body clothing?: A Little Help from another person to put on and taking off regular lower body clothing?: A Lot 6 Click Score: 15    End of  Session    OT Visit Diagnosis: Other abnormalities of gait and mobility (R26.89);Muscle weakness (generalized) (M62.81);Pain Pain - Right/Left: Right Pain - part of body: Leg   Activity Tolerance Patient tolerated treatment well   Patient Left in bed;with call bell/phone within reach;with bed alarm set;with family/visitor present;with nursing/sitter in room   Nurse Communication          Time: 6734-1937 OT Time Calculation (min): 21 min  Charges: OT General Charges $OT Visit: 1 Visit OT Treatments $Self Care/Home Management : 8-22 mins  Dessie Coma, M.S. OTR/L  10/28/19, 1:59 PM

## 2019-10-28 NOTE — Progress Notes (Addendum)
Shift Summary:  Patient had no acute events overnight.  VSS and urinary output adequate via foley.Hemoglobin 7.3 this morning. Foley remains in place. Patient medicated per MAR. Dulcolax suppository admin. Dressing to right leg dry and intact. Ace wrap applied to midsection and RLE clean, dry and, intact.  Patient SR on monitor and will continue to monitor.

## 2019-10-29 DIAGNOSIS — F329 Major depressive disorder, single episode, unspecified: Secondary | ICD-10-CM

## 2019-10-29 DIAGNOSIS — N1831 Chronic kidney disease, stage 3a: Secondary | ICD-10-CM

## 2019-10-29 DIAGNOSIS — N179 Acute kidney failure, unspecified: Secondary | ICD-10-CM

## 2019-10-29 LAB — CBC
HCT: 22.8 % — ABNORMAL LOW (ref 36.0–46.0)
Hemoglobin: 7.4 g/dL — ABNORMAL LOW (ref 12.0–15.0)
MCH: 29.7 pg (ref 26.0–34.0)
MCHC: 32.5 g/dL (ref 30.0–36.0)
MCV: 91.6 fL (ref 80.0–100.0)
Platelets: 212 10*3/uL (ref 150–400)
RBC: 2.49 MIL/uL — ABNORMAL LOW (ref 3.87–5.11)
RDW: 17.2 % — ABNORMAL HIGH (ref 11.5–15.5)
WBC: 7.1 10*3/uL (ref 4.0–10.5)
nRBC: 0.3 % — ABNORMAL HIGH (ref 0.0–0.2)

## 2019-10-29 LAB — BASIC METABOLIC PANEL
Anion gap: 11 (ref 5–15)
BUN: 40 mg/dL — ABNORMAL HIGH (ref 8–23)
CO2: 31 mmol/L (ref 22–32)
Calcium: 8.4 mg/dL — ABNORMAL LOW (ref 8.9–10.3)
Chloride: 98 mmol/L (ref 98–111)
Creatinine, Ser: 0.83 mg/dL (ref 0.44–1.00)
GFR calc Af Amer: >60 mL/min (ref >60)
GFR calc non Af Amer: >60 mL/min (ref >60)
Glucose, Bld: 97 mg/dL (ref 70–99)
Potassium: 3.4 mmol/L — ABNORMAL LOW (ref 3.5–5.1)
Sodium: 140 mmol/L (ref 135–145)

## 2019-10-29 LAB — CULTURE, BLOOD (ROUTINE X 2)
Culture: NO GROWTH
Culture: NO GROWTH
Special Requests: ADEQUATE
Special Requests: ADEQUATE

## 2019-10-29 MED ORDER — ENSURE ENLIVE PO LIQD: 237.0000 mL | Freq: Two times a day (BID) | ORAL | 12 refills | Status: AC

## 2019-10-29 MED ORDER — ENOXAPARIN SODIUM 40 MG/0.4ML ~~LOC~~ SOLN: 40.0000 mg | SUBCUTANEOUS | 14 refills | Status: AC

## 2019-10-29 MED ORDER — DOCUSATE SODIUM 100 MG PO CAPS: 100.0000 mg | ORAL_CAPSULE | Freq: Two times a day (BID) | ORAL | 0 refills | Status: AC

## 2019-10-29 MED ORDER — HYDROCODONE-ACETAMINOPHEN 5-325 MG PO TABS: 1.0000 | ORAL_TABLET | ORAL | 0 refills | Status: AC | PRN

## 2019-10-29 MED ORDER — METHOCARBAMOL 500 MG PO TABS: 500.0000 mg | ORAL_TABLET | Freq: Three times a day (TID) | ORAL | Status: AC | PRN

## 2019-10-29 MED ORDER — POLYETHYLENE GLYCOL 3350 17 G PO PACK: 17.0000 g | PACK | Freq: Every day | ORAL | 0 refills | Status: AC | PRN

## 2019-10-29 MED ORDER — POTASSIUM CHLORIDE CRYS ER 20 MEQ PO TBCR
40.0000 meq | EXTENDED_RELEASE_TABLET | Freq: Once | ORAL | Status: AC
  Administered 2019-10-29: 40 meq via ORAL
  Filled 2019-10-29: qty 2

## 2019-10-29 MED ORDER — BISACODYL 10 MG RE SUPP: 10.0000 mg | Freq: Every day | RECTAL | 0 refills | Status: AC | PRN

## 2019-10-29 NOTE — TOC Transition Note (Signed)
Transition of Care [facility]) - CM/SW Discharge Note   Patient Details  Name: Yvonne Knight MRN: [id number] Date of Birth: May 03, 1926  [facility] CM/SW Contact:  Lucy Chris, LCSW Phone Number: 10/29/2019, 8:50 AM   Clinical Narrative:   Pt is medically stable to transfer to [facility] via [facility]. They are scheduled to come at 12:00. Bedside RN aware of plan along with MD. Will contact son once leaves and he will know to meet her at facility. Report [phone]. Pt is ready for DC today    Final next level of care: Skilled Nursing Facility Barriers to Discharge: Barriers Resolved   Patient Goals and CMS Choice Patient states their goals for this hospitalization and ongoing recovery are:: to go to SNF- wants [facility].gov Compare Post Acute Care list provided to:: Patient Choice offered to / list presented to : Patient  Discharge Placement PASRR number recieved: 10/23/19            Patient chooses bed at: Other - please specify in the comment section below: ([facility]) Patient to be transferred to facility by: [facility] Name of family member notified: Clinton-son Patient and family notified of of transfer: 10/29/19  Discharge Plan and Services     Post Acute Care Choice: Skilled Nursing Facility                               Social Determinants of Health (SDOH) Interventions     Readmission Risk Interventions Readmission Risk Prevention Plan 10/23/2019  Transportation Screening Complete  PCP or Specialist Appt within 3-5 Days Complete  HRI or Home Care Consult Complete  Medication Review (RN Care Manager) Complete  Some recent data might be hidden

## 2019-10-29 NOTE — Progress Notes (Signed)
  Shift Summary:  Patient had no acute events overnight. VSS and urinary output adequate. Patient medicated per MAR. Dressing to right leg dry and intact. Ace wrap  to midsection and RLE clean, dry. Will continue to monitor.

## 2019-10-29 NOTE — Discharge Summary (Signed)
Physician Discharge Summary  Alazay Leicht ZOX:[id number] DOB: 1926/02/11 DOA: 10/21/2019  PCP: Barbette Reichmann, MD  Admit date: 10/21/2019 Discharge date: 10/29/2019  Admitted From: Home Disposition:  SNF  Recommendations for Outpatient Follow-up:  1. Follow up with PCP in 1-2 weeks 2. Follow-up with orthopedic 3. Please obtain BMP/CBC in one week 4. Please follow up on the following pending results:None  Home Health: No Equipment/Devices: Rolling walker Discharge Condition: Stable CODE STATUS: Full Diet recommendation: Heart Healthy   Brief/Interim Summary: Yvonne Knight Jobeis a 86 y.o.Caucasian femalewith medical history significant ofhypertension, hyperlipidemia, depression,dCHF,rheumatoid arthritis, CKD stage III, who presented with mechanical fall, right hip pain. Had right intertrochanteric fracture s/p right intramedullary nail placement on 10/23/2019. X-ray ofpelvis showed comminuted right intertrochanteric to subtrochanteric fracture with lateral angulation.No neurovascular compromise. Orthopedic surgeon, Dr.Krasinskiperformed the surgery. --serosanguineous drainage from her proximal incisions likely due to pressure from swelling, now stopped after reinforced dressing and decrease in swelling.  She will follow-up with orthopedic in 10 to 14 days.  Was discharged on Lovenox every 24 hour for 2 weeks for DVT prophylaxis.  Patient did developed postoperative hypotension most likely secondary to anesthesia and blood loss.  She was given IV fluid and low-dose pressors.  Hypotension resolved next day.  Her antihypertensives were later gradually restarted.  She was having blood pressure within goal without amlodipine so were discontinued on discharge.  Amlodipine can be readded by PCP if needed.  Patient did developed a fever postoperatively.  No sign of infection at surgical site.  Blood cultures and cultures from incision site remains negative.  Procalcitonin remain  negative.  She was given one-time dose of vancomycin and cefepime.  Patient has an history of diastolic dysfunction with normal EF.  She developed anasarca and volume overload requiring IV diuresis with Lasix and metolazone.  She appears euvolemic on discharge and will resume her home dose of Lasix and will follow up with her cardiologist.  Patient developed anemia postoperatively.  Hemoglobin on admission was 12.7.  Decreased to below 7 requiring 3 units of PRBCs.  Hemoglobin seems stable around 7.5.  Anemia panel with anemia of chronic disease.  She will need monitoring of her hemoglobin.  She also developed AKI with history of CKD stage IIIa.  Which was resolved with IV fluid.  Initially home dose of Cozaar was held and it can be resumed on discharge.  Patient has an history of rheumatoid arthritis and will continue her home meds and follow-up with her rheumatologist.  Discharge Diagnoses:  Principal Problem:   Intertrochanteric fracture of right hip (HCC) Active Problems:   HTN (hypertension)   Chronic diastolic CHF (congestive heart failure) (HCC)   Depression   Acute renal failure superimposed on stage 3a chronic kidney disease (HCC)   Fall at home, initial encounter   Rheumatoid arthritis (HCC)   Closed right hip fracture, initial encounter [facility])   Discharge Instructions  Discharge Instructions    Diet - low sodium heart healthy   Complete by: As directed    Increase activity slowly   Complete by: As directed    No dressing needed   Complete by: As directed      Allergies as of 10/29/2019   No Known Allergies     Medication List    STOP taking these medications   amLODipine 5 MG tablet Commonly known as: NORVASC     TAKE these medications   acetaminophen 500 MG tablet Commonly known as: TYLENOL Take 500 mg by mouth in the  morning and at bedtime.   alendronate 70 MG tablet Commonly known as: FOSAMAX Take 70 mg by mouth once a week.   AVASTIN IV Inject into  the vein.   bisacodyl 10 MG suppository Commonly known as: DULCOLAX Place 1 suppository (10 mg total) rectally daily as needed for moderate constipation.   CALCIUM 600+D PO Take 1 tablet by mouth daily.   Cholecalciferol 25 MCG (1000 UT) tablet Take by mouth.   citalopram 20 MG tablet Commonly known as: CELEXA Take 20 mg by mouth daily.   docusate sodium 100 MG capsule Commonly known as: COLACE Take 1 capsule (100 mg total) by mouth 2 (two) times daily.   enoxaparin 40 MG/0.4ML injection Commonly known as: LOVENOX Inject 0.4 mLs (40 mg total) into the skin daily.   feeding supplement (ENSURE ENLIVE) Liqd Take 237 mLs by mouth 2 (two) times daily between meals.   folic acid 1 MG tablet Commonly known as: FOLVITE Take 1 mg by mouth daily.   furosemide 20 MG tablet Commonly known as: LASIX Take 20 mg by mouth daily.   HYDROcodone-acetaminophen 5-325 MG tablet Commonly known as: NORCO/VICODIN Take 1-2 tablets by mouth every 4 (four) hours as needed for moderate pain (pain score 4-6).   losartan 25 MG tablet Commonly known as: COZAAR Take 25 mg by mouth daily.   methocarbamol 500 MG tablet Commonly known as: ROBAXIN Take 1 tablet (500 mg total) by mouth every 8 (eight) hours as needed for muscle spasms.   methotrexate 2.5 MG tablet Commonly known as: RHEUMATREX Take 10 mg by mouth once a week.   OLANZapine 2.5 MG tablet Commonly known as: ZYPREXA SMARTSIG:1 Tablet(s) By Mouth Every Night   polyethylene glycol 17 g packet Commonly known as: MIRALAX / GLYCOLAX Take 17 g by mouth daily as needed for mild constipation.   potassium chloride 10 MEQ tablet Commonly known as: KLOR-CON Take 10 mEq by mouth daily.   PRESERVISION AREDS 2+MULTI VIT PO Take by mouth.   vitamin B-12 1000 MCG tablet Commonly known as: CYANOCOBALAMIN Take 1,000 mcg by mouth daily.            Discharge Care Instructions  (From admission, onward)         Start     Ordered    10/29/19 0000  No dressing needed        10/29/19 1107          Follow-up Information    Barbette Reichmann, MD Follow up.   Specialty: Internal Medicine Why: Please schedule PCP appointment within 5-7 days of discharge date. Contact information: [address] [place] [address] [phone]        Juanell Fairly, MD. Schedule an appointment as soon as possible for a visit.   Specialty: Orthopedic Surgery Contact information: [address] [place] [address] [phone]              No Known Allergies  Consultations:  Orthopedic surgery  Procedures/Studies: DG Chest 1 View  Result Date: 10/21/2019 CLINICAL DATA:  Acute right hip fracture. EXAM: CHEST  1 VIEW COMPARISON:  02/07/2016 FINDINGS: Heart size is normal. Chronic aortic atherosclerosis. The lungs are clear. No edema. No infiltrate, collapse or effusion. Old healed right humeral fracture. Previously augmented T12 or L1 compression fracture. IMPRESSION: No active cardiopulmonary disease.  Aortic atherosclerosis. Electronically Signed   By: Paulina Fusi M.D.   On: 10/21/2019 13:44   CT HEAD WO CONTRAST  Result Date: 10/21/2019  CLINICAL DATA:  Fall EXAM: CT HEAD WITHOUT CONTRAST TECHNIQUE: Contiguous axial images were obtained from the base of the skull through the vertex without intravenous contrast. COMPARISON:  None. FINDINGS: Brain: There is no mass, hemorrhage or extra-axial collection. The size and configuration of the ventricles and extra-axial CSF spaces are normal. There is hypoattenuation of the white matter, most commonly indicating chronic small vessel disease. Vascular: Atherosclerotic calcification of the vertebral and internal carotid arteries at the skull base. No abnormal hyperdensity of the major intracranial arteries or dural venous sinuses. Skull: The visualized skull base, calvarium and extracranial soft tissues are normal. Sinuses/Orbits: No fluid levels or  advanced mucosal thickening of the visualized paranasal sinuses. No mastoid or middle ear effusion. The orbits are normal. IMPRESSION: Chronic small vessel disease without acute intracranial abnormality. Electronically Signed   By: Deatra Robinson M.D.   On: 10/21/2019 19:41   DG Chest Port 1 View  Result Date: 10/25/2019 CLINICAL DATA:  Fevers EXAM: PORTABLE CHEST 1 VIEW COMPARISON:  10/21/2019 FINDINGS: Cardiac shadow is stable. Aortic calcifications are again seen. The lungs are well aerated bilaterally. No focal infiltrate is seen. Minimal blunting of left costophrenic angle is noted. Degenerative changes of the shoulder joints and thoracic spine are noted. IMPRESSION: Minimal left basilar effusion.  No other focal abnormality is noted. Electronically Signed   By: Alcide Clever M.D.   On: 10/25/2019 15:12   DG HIP OPERATIVE UNILAT W OR W/O PELVIS RIGHT  Result Date: 10/22/2019 CLINICAL DATA:  Right hip fracture. EXAM: OPERATIVE right HIP (WITH PELVIS IF PERFORMED) 4 VIEWS, POSTOPERATIVE VIEWS OF THE RIGHT HIP. TECHNIQUE: Fluoroscopic spot image(s) were submitted for interpretation post-operatively. COMPARISON:  Radiographs 10/21/2019 FINDINGS: Fluoroscopic intraoperative spot films and postoperative radiographs demonstrate a long intramedullary gamma nail in the femur with a proximal dynamic hip screw and a single distal interlocking screw. The dynamic hip screw appears to be in the fracture. There is approximately slightly more than 1 cortex width of medial displacement. IMPRESSION: Internal fixation of comminuted intertrochanteric fracture. Electronically Signed   By: Rudie Meyer M.D.   On: 10/22/2019 17:25   DG Hip Unilat W or Wo Pelvis 2-3 Views Right  Result Date: 10/21/2019 CLINICAL DATA:  Fall with right hip pain. EXAM: DG HIP (WITH OR WITHOUT PELVIS) 2-3V RIGHT COMPARISON:  None. FINDINGS: Acute intertrochanteric to subtrochanteric fracture of the right femur with lateral angulation. No evidence  of pelvic fracture. IMPRESSION: Comminuted right intertrochanteric to subtrochanteric fracture with lateral angulation. Electronically Signed   By: Paulina Fusi M.D.   On: 10/21/2019 13:43   DG FEMUR, MIN 2 VIEWS RIGHT  Result Date: 10/22/2019 CLINICAL DATA:  Right hip fracture. EXAM: OPERATIVE right HIP (WITH PELVIS IF PERFORMED) 4 VIEWS, POSTOPERATIVE VIEWS OF THE RIGHT HIP. TECHNIQUE: Fluoroscopic spot image(s) were submitted for interpretation post-operatively. COMPARISON:  Radiographs 10/21/2019 FINDINGS: Fluoroscopic intraoperative spot films and postoperative radiographs demonstrate a long intramedullary gamma nail in the femur with a proximal dynamic hip screw and a single distal interlocking screw. The dynamic hip screw appears to be in the fracture. There is approximately slightly more than 1 cortex width of medial displacement. IMPRESSION: Internal fixation of comminuted intertrochanteric fracture. Electronically Signed   By: Rudie Meyer M.D.   On: 10/22/2019 17:25    Subjective: Patient was feeling better when seen today.  No new complaints.  Discharge Exam: Vitals:   10/28/19 2250 10/29/19 0735  BP: (!) 107/39 (!) 112/47  Pulse: 87 79  Resp: 20 20  Temp: 97.8 F (36.6 C) 97.9 F (36.6 C)  SpO2: 97% 92%   Vitals:   10/28/19 1937 10/28/19 2250 10/29/19 0500 10/29/19 0735  BP: (!) 137/50 (!) 107/39  (!) 112/47  Pulse: 86 87  79  Resp: Temp: 98 F (36.7 C) 97.8 F (36.6 C)  97.9 F (36.6 C)  TempSrc: Oral Oral  Oral  SpO2: 93% 97%  92%  Weight:   61 kg   Height:        General: Pt is alert, awake, not in acute distress Cardiovascular: RRR, S1/S2 +, no rubs, no gallops Respiratory: CTA bilaterally, no wheezing, no rhonchi Abdominal: Soft, NT, ND, bowel sounds + Extremities: no edema, no cyanosis   The results of significant diagnostics from this hospitalization (including imaging, microbiology, ancillary and laboratory) are listed below for reference.     Microbiology: Recent Results (from the past 240 hour(s))  SARS Coronavirus 2 by RT PCR (hospital order, performed in [facility] hospital lab) Nasopharyngeal Nasopharyngeal Swab     Status: None   Collection Time: 10/21/19  2:09 PM   Specimen: Nasopharyngeal Swab  Result Value Ref Range Status   SARS Coronavirus 2 NEGATIVE NEGATIVE Final    Comment: (NOTE) SARS-CoV-2 target nucleic acids are NOT DETECTED. The SARS-CoV-2 RNA is generally detectable in upper and lower respiratory specimens during the acute phase of infection. The lowest concentration of SARS-CoV-2 viral copies this assay can detect is 250 copies / mL. A negative result does not preclude SARS-CoV-2 infection and should not be used as the sole basis for treatment or other patient management decisions.  A negative result may occur with improper specimen collection / handling, submission of specimen other than nasopharyngeal swab, presence of viral mutation(s) within the areas targeted by this assay, and inadequate number of viral copies (<250 copies / mL). A negative result must be combined with clinical observations, patient history, and epidemiological information. Fact Sheet for Patients:   BoilerBrush.com.cy Fact Sheet for Healthcare Providers: https://pope.com/ This test is not yet approved or cleared  by the [place] FDA and has been authorized for detection and/or diagnosis of SARS-CoV-2 by FDA under an Emergency Use Authorization (EUA).  This EUA will remain in effect (meaning this test can be used) for the duration of the COVID-19 declaration under Section 564(b)(1) of the Act, 21 U.S.C. section 360bbb-3(b)(1), unless the authorization is terminated or revoked sooner. Performed at [facility], [address] [place], [place] [address]   MRSA PCR Screening     Status: None   Collection Time: 10/22/19  8:22 PM   Specimen: Nasopharyngeal  Result  Value Ref Range Status   MRSA by PCR NEGATIVE NEGATIVE Final    Comment:        The GeneXpert MRSA Assay (FDA approved for NASAL specimens only), is one component of a comprehensive MRSA colonization surveillance program. It is not intended to diagnose MRSA infection nor to guide or monitor treatment for MRSA infections. Performed at [facility], [address] [place], [place] [address]   Culture, blood (Routine X 2) w Reflex to ID Panel     Status: None   Collection Time: 10/24/19  1:23 PM   Specimen: BLOOD  Result Value Ref Range Status   Specimen Description BLOOD BLLA  Final   Special Requests   Final    BOTTLES DRAWN AEROBIC AND ANAEROBIC Blood Culture adequate volume   Culture   Final    NO  GROWTH 5 DAYS Performed at [facility], [address] [place], [place] [address]    Report Status 10/29/2019 FINAL  Final  Culture, blood (Routine X 2) w Reflex to ID Panel     Status: None   Collection Time: 10/24/19  1:34 PM   Specimen: BLOOD  Result Value Ref Range Status   Specimen Description BLOOD [facility]  Final   Special Requests   Final    BOTTLES DRAWN AEROBIC AND ANAEROBIC Blood Culture adequate volume   Culture   Final    NO GROWTH 5 DAYS Performed at [facility], [address], [place], [place] [address]    Report Status 10/29/2019 FINAL  Final  Aerobic Culture (superficial specimen)     Status: None   Collection Time: 10/24/19  2:40 PM   Specimen: Wound  Result Value Ref Range Status   Specimen Description   Final    WOUND Performed at [facility], [address], [place], [place] [address]    Special Requests   Final    NONE Performed at [facility], [address] [place], [place] [address]    Gram Stain NO WBC SEEN NO ORGANISMS SEEN   Final   Culture   Final    NO GROWTH Performed at [facility], [address], [place], [place] [address]    Report Status 10/27/2019 FINAL  Final  SARS  CORONAVIRUS 2 (TAT 6-24 HRS) Nasopharyngeal Nasopharyngeal Swab     Status: None   Collection Time: 10/28/19 10:50 AM   Specimen: Nasopharyngeal Swab  Result Value Ref Range Status   SARS Coronavirus 2 NEGATIVE NEGATIVE Final    Comment: (NOTE) SARS-CoV-2 target nucleic acids are NOT DETECTED.  The SARS-CoV-2 RNA is generally detectable in upper and lower respiratory specimens during the acute phase of infection. Negative results do not preclude SARS-CoV-2 infection, do not rule out co-infections with other pathogens, and should not be used as the sole basis for treatment or other patient management decisions. Negative results must be combined with clinical observations, patient history, and epidemiological information. The expected result is Negative.  Fact Sheet for Patients: HairSlick.no  Fact Sheet for Healthcare Providers: quierodirigir.com  This test is not yet approved or cleared by the [place] FDA and  has been authorized for detection and/or diagnosis of SARS-CoV-2 by FDA under an Emergency Use Authorization (EUA). This EUA will remain  in effect (meaning this test can be used) for the duration of the COVID-19 declaration under Se ction 564(b)(1) of the Act, 21 U.S.C. section 360bbb-3(b)(1), unless the authorization is terminated or revoked sooner.  Performed at [facility], [address], [place], [place] [address]      Labs: BNP (last 3 results) Recent Labs    10/21/19 1250  BNP 397.5*   Basic Metabolic Panel: Recent Labs  Lab 10/24/19 0441 10/24/19 0441 10/25/19 0433 10/26/19 0628 10/27/19 0513 10/28/19 0535 10/29/19 0457  NA 141   < > 139 137 138 137 140  K 4.3   < > 4.1 3.4* 3.5 3.2* 3.4*  CL 108   < > 107 100 100 96* 98  CO2 29   < > 25 29 30 30 31   GLUCOSE 108*   < > 110* 104* 102* 106* 97  BUN 26*   < > 36* 39* 37* 33* 40*  CREATININE 0.78   < > 0.85 0.82 0.90 0.73 0.83   CALCIUM 7.4*   < >  7.6* 8.0* 7.9* 8.0* 8.4*  MG 1.8  --  2.0 1.8 1.9 1.8  --    < > = values in this interval not displayed.   Liver Function Tests: No results for input(s): AST, ALT, ALKPHOS, BILITOT, PROT, ALBUMIN in the last 168 hours. No results for input(s): LIPASE, AMYLASE in the last 168 hours. No results for input(s): AMMONIA in the last 168 hours. CBC: Recent Labs  Lab 10/25/19 1402 10/26/19 0628 10/27/19 0513 10/28/19 0535 10/29/19 0457  WBC 6.1 6.2 6.3 6.9 7.1  HGB 7.3* 7.4* 7.3* 7.6* 7.4*  HCT 21.9* 21.5* 21.1* 22.1* 22.8*  MCV 89.0 86.7 87.2 87.7 91.6  PLT 126* 129* 159 182 212   Cardiac Enzymes: No results for input(s): CKTOTAL, CKMB, CKMBINDEX, TROPONINI in the last 168 hours. BNP: Invalid input(s): POCBNP CBG: No results for input(s): GLUCAP in the last 168 hours. D-Dimer No results for input(s): DDIMER in the last 72 hours. Hgb A1c No results for input(s): HGBA1C in the last 72 hours. Lipid Profile No results for input(s): CHOL, HDL, LDLCALC, TRIG, CHOLHDL, LDLDIRECT in the last 72 hours. Thyroid function studies No results for input(s): TSH, T4TOTAL, T3FREE, THYROIDAB in the last 72 hours.  Invalid input(s): FREET3 Anemia work up Recent Labs    10/28/19 0827  VITAMINB12 1,747*  FOLATE 24.0  FERRITIN 218  TIBC 214*  IRON 35  RETICCTPCT 2.9   Urinalysis    Component Value Date/Time   COLORURINE YELLOW 04/29/2019 1520   APPEARANCEUR CLEAR 04/29/2019 1520   LABSPEC 1.020 04/29/2019 1520   PHURINE 5.5 04/29/2019 1520   GLUCOSEU NEGATIVE 04/29/2019 1520   HGBUR MODERATE (A) 04/29/2019 1520   BILIRUBINUR NEGATIVE 04/29/2019 1520   KETONESUR NEGATIVE 04/29/2019 1520   PROTEINUR NEGATIVE 04/29/2019 1520   NITRITE NEGATIVE 04/29/2019 1520   LEUKOCYTESUR NEGATIVE 04/29/2019 1520   Sepsis Labs Invalid input(s): PROCALCITONIN,  WBC,  LACTICIDVEN Microbiology Recent Results (from the past 240 hour(s))  SARS Coronavirus 2 by RT PCR (hospital  order, performed in [facility] hospital lab) Nasopharyngeal Nasopharyngeal Swab     Status: None   Collection Time: 10/21/19  2:09 PM   Specimen: Nasopharyngeal Swab  Result Value Ref Range Status   SARS Coronavirus 2 NEGATIVE NEGATIVE Final    Comment: (NOTE) SARS-CoV-2 target nucleic acids are NOT DETECTED. The SARS-CoV-2 RNA is generally detectable in upper and lower respiratory specimens during the acute phase of infection. The lowest concentration of SARS-CoV-2 viral copies this assay can detect is 250 copies / mL. A negative result does not preclude SARS-CoV-2 infection and should not be used as the sole basis for treatment or other patient management decisions.  A negative result may occur with improper specimen collection / handling, submission of specimen other than nasopharyngeal swab, presence of viral mutation(s) within the areas targeted by this assay, and inadequate number of viral copies (<250 copies / mL). A negative result must be combined with clinical observations, patient history, and epidemiological information. Fact Sheet for Patients:   BoilerBrush.com.cy Fact Sheet for Healthcare Providers: https://pope.com/ This test is not yet approved or cleared  by the [place] FDA and has been authorized for detection and/or diagnosis of SARS-CoV-2 by FDA under an Emergency Use Authorization (EUA).  This EUA will remain in effect (meaning this test can be used) for the duration of the COVID-19 declaration under Section 564(b)(1) of the Act, 21 U.S.C. section 360bbb-3(b)(1), unless the authorization is terminated or revoked sooner. Performed at [facility], [phone]  [address] [place], [place] [address]   MRSA PCR Screening     Status: None   Collection Time: 10/22/19  8:22 PM   Specimen: Nasopharyngeal  Result Value Ref Range Status   MRSA by PCR NEGATIVE NEGATIVE Final    Comment:        The GeneXpert MRSA  Assay (FDA approved for NASAL specimens only), is one component of a comprehensive MRSA colonization surveillance program. It is not intended to diagnose MRSA infection nor to guide or monitor treatment for MRSA infections. Performed at [facility], [address] [place], [place] [address]   Culture, blood (Routine X 2) w Reflex to ID Panel     Status: None   Collection Time: 10/24/19  1:23 PM   Specimen: BLOOD  Result Value Ref Range Status   Specimen Description BLOOD BLLA  Final   Special Requests   Final    BOTTLES DRAWN AEROBIC AND ANAEROBIC Blood Culture adequate volume   Culture   Final    NO GROWTH 5 DAYS Performed at [facility], [address] [place], [place] [address]    Report Status 10/29/2019 FINAL  Final  Culture, blood (Routine X 2) w Reflex to ID Panel     Status: None   Collection Time: 10/24/19  1:34 PM   Specimen: BLOOD  Result Value Ref Range Status   Specimen Description BLOOD [facility]  Final   Special Requests   Final    BOTTLES DRAWN AEROBIC AND ANAEROBIC Blood Culture adequate volume   Culture   Final    NO GROWTH 5 DAYS Performed at [facility], [address], [place], [place] [address]    Report Status 10/29/2019 FINAL  Final  Aerobic Culture (superficial specimen)     Status: None   Collection Time: 10/24/19  2:40 PM   Specimen: Wound  Result Value Ref Range Status   Specimen Description   Final    WOUND Performed at [facility], [address] [place], [place] [address]    Special Requests   Final    NONE Performed at [facility], [address], [place], [place] [address]    Gram Stain NO WBC SEEN NO ORGANISMS SEEN   Final   Culture   Final    NO GROWTH Performed at [facility], [place] [address], [place], [place] [address]    Report Status 10/27/2019 FINAL  Final  SARS CORONAVIRUS 2 (TAT 6-24 HRS) Nasopharyngeal Nasopharyngeal Swab     Status: None   Collection Time:  10/28/19 10:50 AM   Specimen: Nasopharyngeal Swab  Result Value Ref Range Status   SARS Coronavirus 2 NEGATIVE NEGATIVE Final    Comment: (NOTE) SARS-CoV-2 target nucleic acids are NOT DETECTED.  The SARS-CoV-2 RNA is generally detectable in upper and lower respiratory specimens during the acute phase of infection. Negative results do not preclude SARS-CoV-2 infection, do not rule out co-infections with other pathogens, and should not be used as the sole basis for treatment or other patient management decisions. Negative results must be combined with clinical observations, patient history, and epidemiological information. The expected result is Negative.  Fact Sheet for Patients: SugarRoll.be  Fact Sheet for Healthcare Providers: https://www.woods-mathews.com/  This test is not yet approved or cleared by the [place] FDA and  has been authorized for detection and/or diagnosis of SARS-CoV-2 by FDA under an Emergency Use Authorization (EUA). This EUA will remain  in effect (meaning this test can  be used) for the duration of the COVID-19 declaration under Se ction 564(b)(1) of the Act, 21 U.S.C. section 360bbb-3(b)(1), unless the authorization is terminated or revoked sooner.  Performed at [facility], [address] [place], [place] [address]     Time coordinating discharge: Over 30 minutes  SIGNED:  Arnetha Courser, MD  [facility] 10/29/2019, 11:09 AM  If 7PM-7AM, please contact night-coverage [email or web address]  This record has been created using Conservation officer, historic buildings. Errors have been sought and corrected,but may not always be located. Such creation errors do not reflect on the standard of care.

## 2019-10-29 NOTE — Progress Notes (Signed)
Physical Therapy Treatment Patient Details Name: Yvonne Knight MRN: [id number] DOB: 10-13-1925 Today's Date: 10/29/2019    History of Present Illness 86 y.o. female with medical history significant for hypertension, hyperlipidemia, depression, dCHF, rheumatoid arthritis, CKD stage III, who presented with mechanical fall, right hip pain. Imaging showed right comminuted four-part intertrochanteric hip fracture with significant displacement. Pt is now S/p R IM fixation. Pt to be NWB in RLE. Pt sent to stepdown postoperatively due to hypotension in the PACU.    PT Comments    Pt was long sitting in bed upon arriving. She is very pleasant and agreeable to PT session. She is slightly HOH but conversational. She reports feeling well this date and states she tried to sit up last night but was not able. She required max assist to sit EOB and return to supine after EOB activity. Pt able to maintain balance seated EOB with UE support only. Performed exercises at EOB and in bed. Overall pt tolerated well but is unable to trial transfers yet OOB 2/2 to not able to adhere to wt bearing restrictions. PT will benefit from SNF at DC to address strength and safe functional mobility deficits. Acute PT will continue to follow per current POC. At conclusion of session, pt was in bed with call bell in reach, bed alarm in place, and pt resting comfortably.      Follow Up Recommendations  SNF     Equipment Recommendations  None recommended by PT    Recommendations for Other Services       Precautions / Restrictions Precautions Precautions: Fall Precaution Comments: High Fall Restrictions Weight Bearing Restrictions: Yes RLE Weight Bearing: Non weight bearing    Mobility  Bed Mobility Overal bed mobility: Needs Assistance Bed Mobility: Supine to Sit;Sit to Supine     Supine to sit: Max assist Sit to supine: Max assist   General bed mobility comments: Max assist to progress form long sitting to  short sit with increased time and vcs required.  Transfers                 General transfer comment: unsafe to trial at this time 2/2 to weakness and inabilityto maintain proper wt bearing  Ambulation/Gait             General Gait Details: defer   Stairs             Wheelchair Mobility    Modified Rankin (Stroke Patients Only)       Balance Overall balance assessment: Needs assistance Sitting-balance support: Bilateral upper extremity supported Sitting balance-Leahy Scale: Good Sitting balance - Comments: pt sat EOB without feet supported with supervision                                    Cognition Arousal/Alertness: Awake/alert Behavior During Therapy: WFL for tasks assessed/performed Overall Cognitive Status: Within Functional Limits for tasks assessed                                 General Comments: pt is A and O x 3 and able to cooperate and follow commands throughout      Exercises General Exercises - Lower Extremity Ankle Circles/Pumps: AROM;10 reps Quad Sets: AROM;10 reps Gluteal Sets: AROM;10 reps Heel Slides: AAROM;10 reps Hip ABduction/ADduction: AAROM;10 reps Straight Leg Raises: AAROM;10 reps    General Comments  Pertinent Vitals/Pain Pain Assessment: 0-10 Pain Score: 2  Faces Pain Scale: Hurts a little bit Pain Location: R hip Pain Descriptors / Indicators: Grimacing;Guarding Pain Intervention(s): Limited activity within patient's tolerance;Monitored during session;Premedicated before session;Repositioned    Home Living                      Prior Function            PT Goals (current goals can now be found in the care plan section) Acute Rehab PT Goals Patient Stated Goal: To get stronger Progress towards PT goals: Progressing toward goals    Frequency    BID      PT Plan Current plan remains appropriate    Co-evaluation     PT goals addressed during session:  Strengthening/ROM;Balance;Proper use of DME        AM-PAC PT "6 Clicks" Mobility   Outcome Measure  Help needed turning from your back to your side while in a flat bed without using bedrails?: A Lot Help needed moving from lying on your back to sitting on the side of a flat bed without using bedrails?: A Lot Help needed moving to and from a bed to a chair (including a wheelchair)?: Total Help needed standing up from a chair using your arms (e.g., wheelchair or bedside chair)?: Total Help needed to walk in hospital room?: Total Help needed climbing 3-5 steps with a railing? : Total 6 Click Score: 8    End of Session Equipment Utilized During Treatment: Gait belt Activity Tolerance: Patient tolerated treatment well Patient left: in bed;with call bell/phone within reach;with bed alarm set;with SCD's reapplied Nurse Communication: Mobility status PT Visit Diagnosis: Muscle weakness (generalized) (M62.81);Difficulty in walking, not elsewhere classified (R26.2);Pain Pain - Right/Left: Right Pain - part of body: Hip     Time: 0930-1000 PT Time Calculation (min) (ACUTE ONLY): 30 min  Charges:  $Therapeutic Exercise: 8-22 mins $Therapeutic Activity: 8-22 mins                     Jetta Lout PTA 10/29/19, 10:36 AM

## 2019-10-29 NOTE — Plan of Care (Signed)

## 2019-10-29 NOTE — Progress Notes (Signed)
Pt left via ems  At 1220 w/o c/o. Vss/ sl d/cd. Report called to jennifer at [facility] in [place].  Voided lge amt  And stool prior to leaving and  norco given for c/o pain and for transport.

## 2020-07-13 IMAGING — US US RENAL
1 series · 14 of 25 positions shown · non-contrast
Comparison: No recent prior.

CLINICAL DATA: Microscopic hematuria.

EXAM:
RENAL / URINARY TRACT ULTRASOUND COMPLETE

[Series 1: us renal · 0.21mm/px · 14 of 39 slices shown]
[im 1/39]
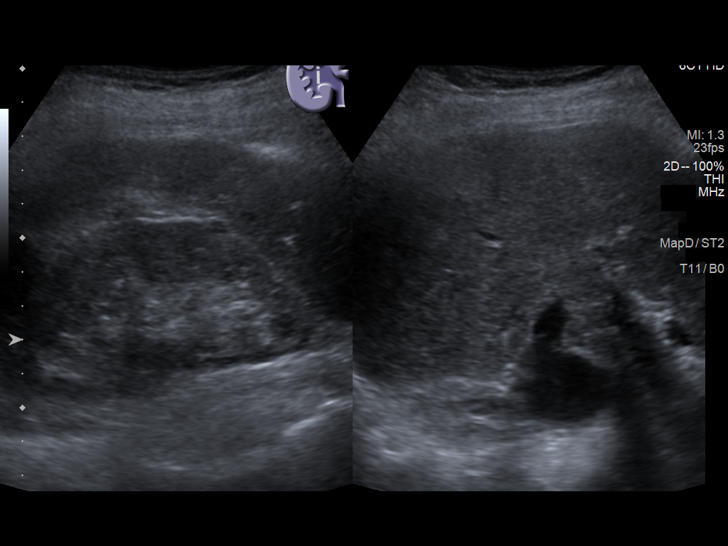
[im 4/39]
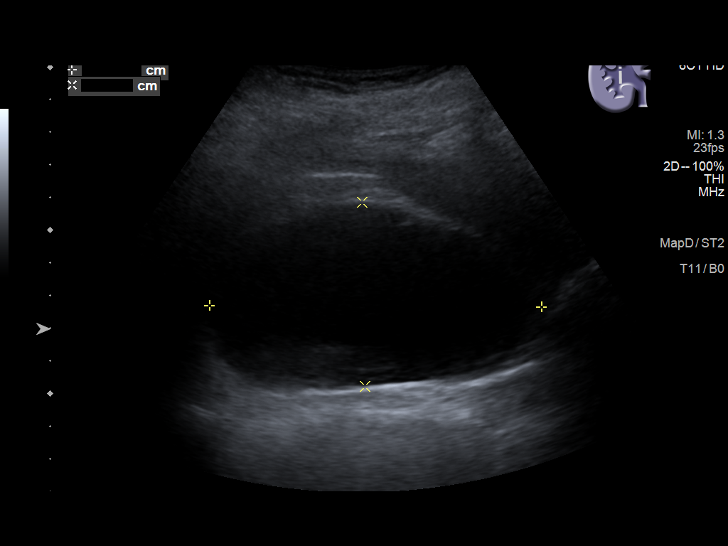
[im 7/39]
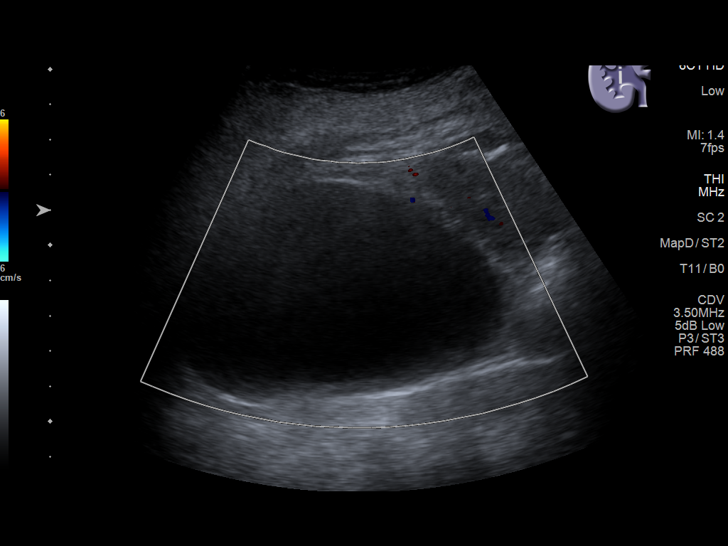
[im 10/39]
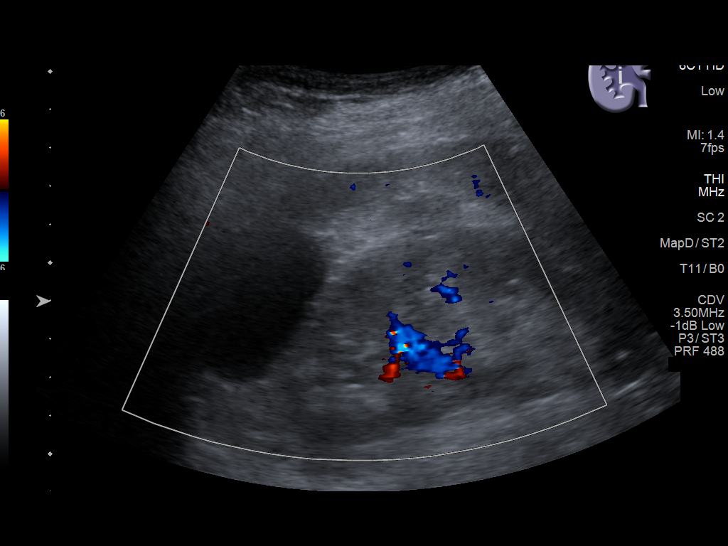
[im 13/39]
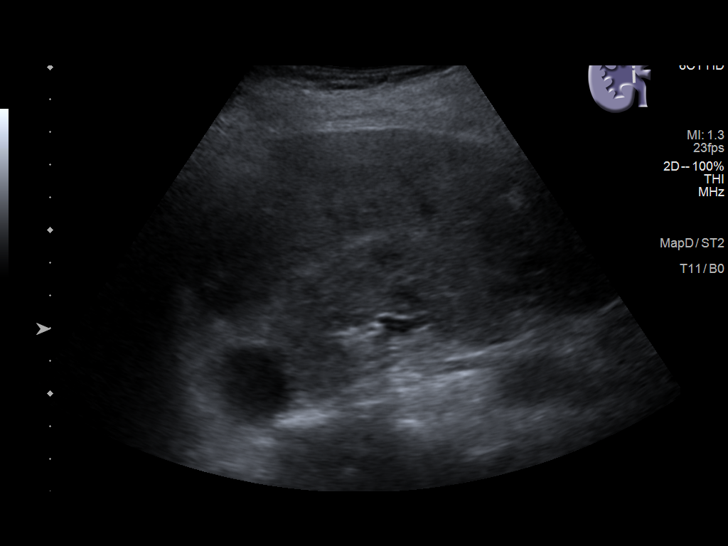
[im 15/39]
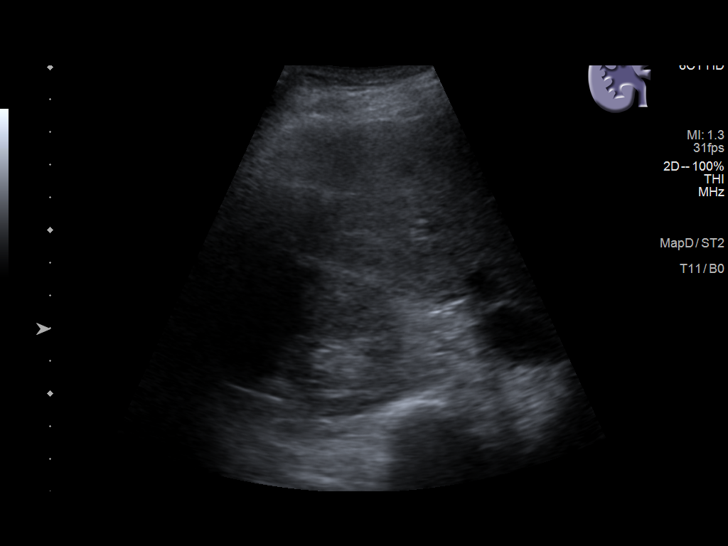
[im 18/39]
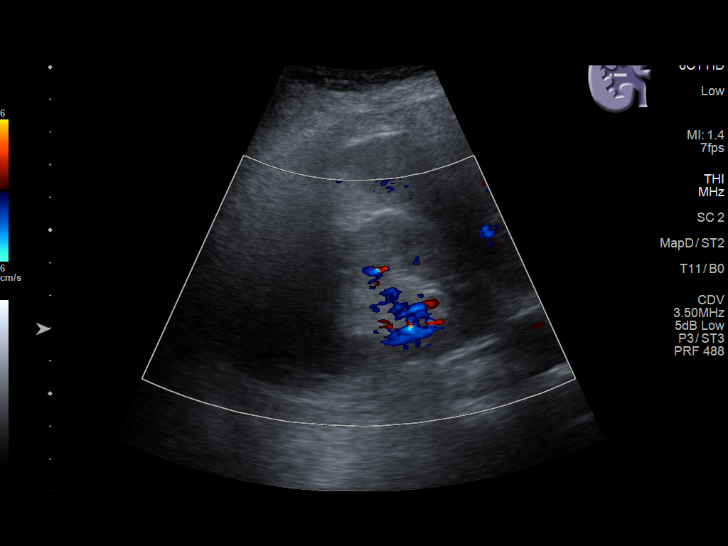
[im 21/39]
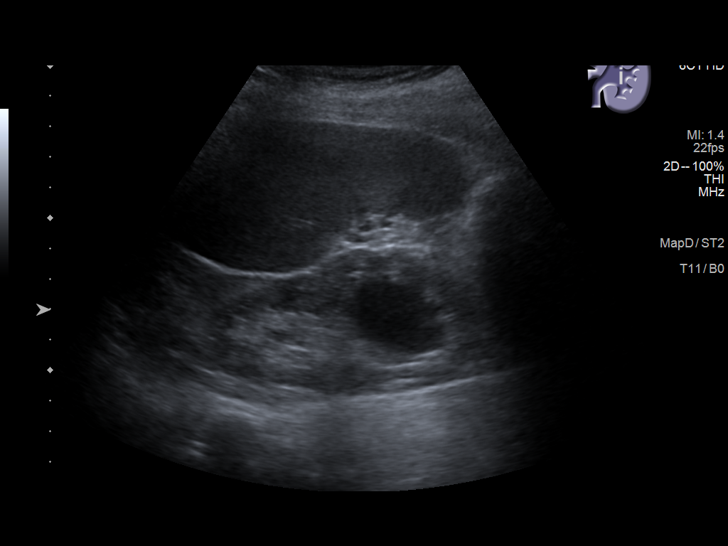
[im 24/39]
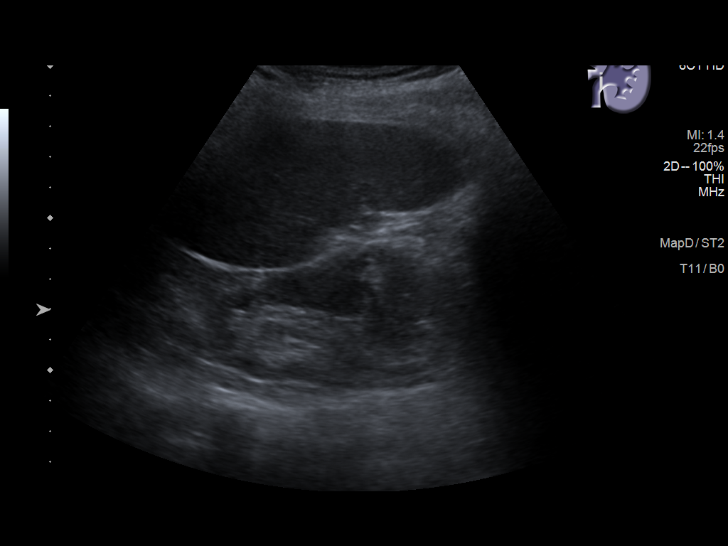
[im 26/39]
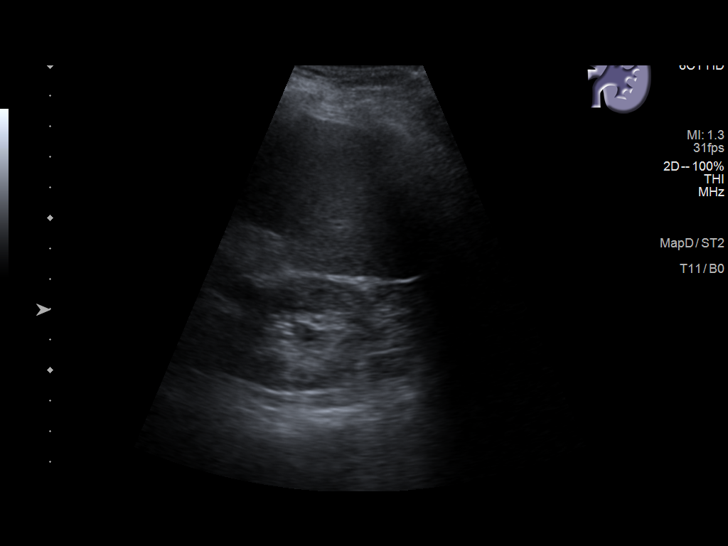
[im 29/39]
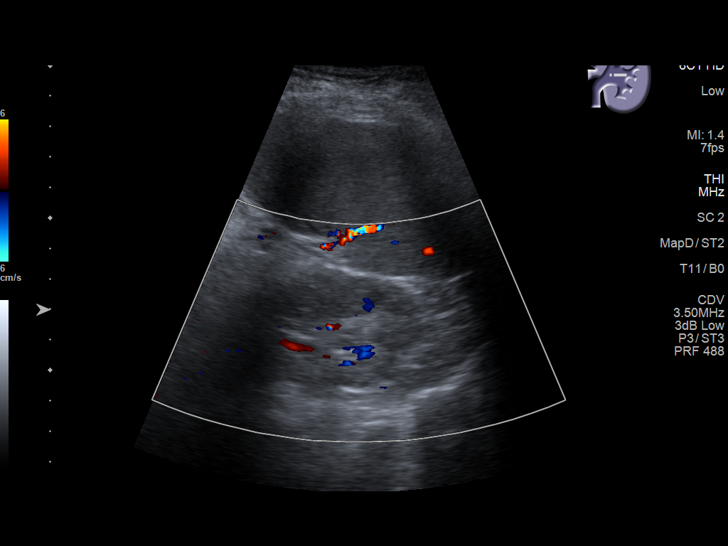
[im 32/39]
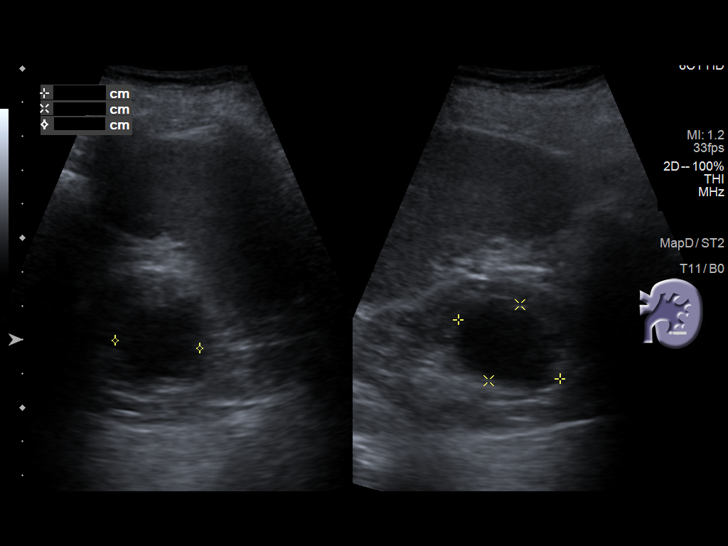
[im 35/39]
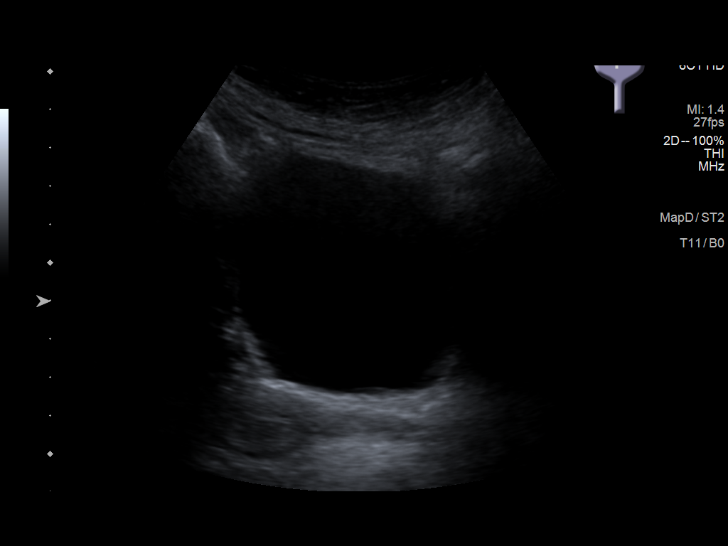
[im 39/39]
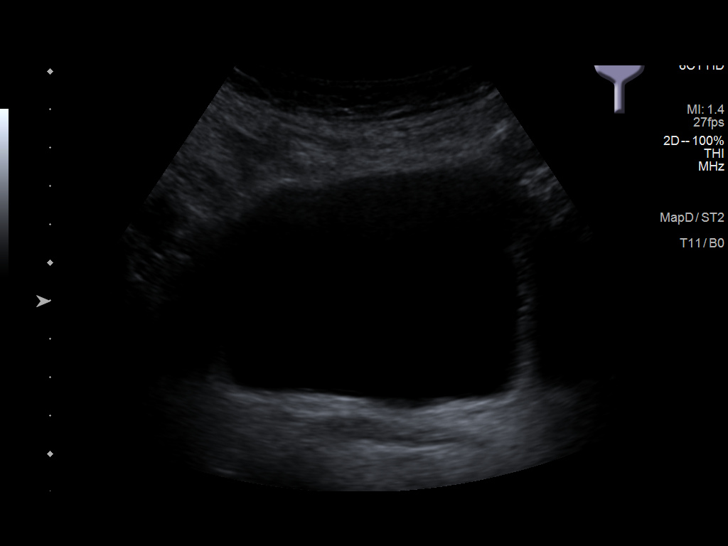

[14 of 25 positions shown; findings below may reference images not displayed]

FINDINGS: Right Kidney:

Renal measurements: 10.5 x 4.9 x 4.8 cm = volume: 129.7 mL.
Increased echogenicity. No hydronephrosis visualized. 10.2 cm simple
cyst. 4.4 cm simple cyst.

Left Kidney:No hydronephrosis visualized.

Renal measurements: 10.4 x 4.9 x 4.4 cm = volume: 116.2 mL.
Increased echogenicity. 3.5 cm simple cyst.

Bladder:

Appears normal for degree of bladder distention.

Other:

None.
IMPRESSION: 1. Bilateral increased renal echogenicity consistent chronic medical
renal disease.

2. Bilateral simple renal cysts. No acute abnormality. No
hydronephrosis.

## 2020-12-12 IMAGING — DX DG FEMUR 2+V*R*
4 series · 4 of 4 positions shown · non-contrast
Comparison: Radiographs 10/21/2019

CLINICAL DATA: Right hip fracture.

EXAM:
OPERATIVE right HIP (WITH PELVIS IF PERFORMED) 4 VIEWS,
POSTOPERATIVE VIEWS OF THE RIGHT HIP.
TECHNIQUE: Fluoroscopic spot image(s) were submitted for interpretation
post-operatively.

[femur ap (1 of 2)]
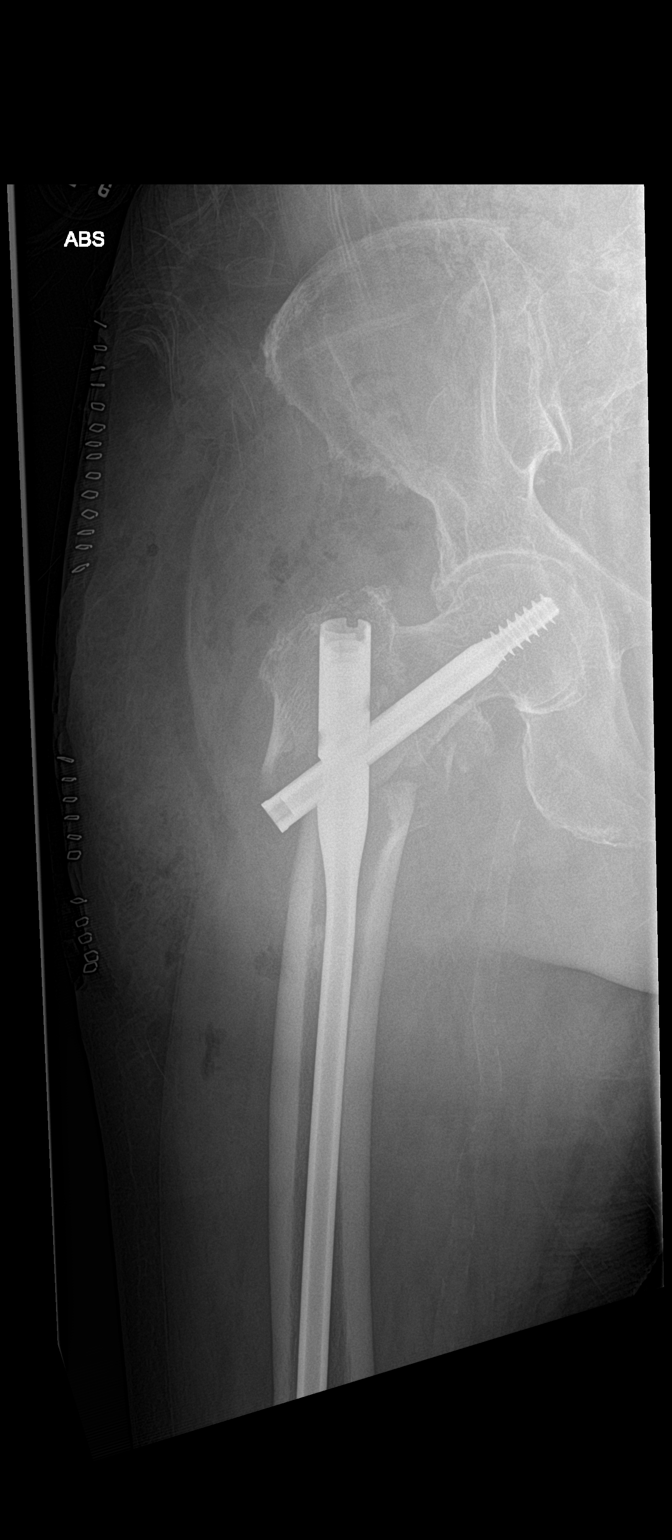

[femur ap (2 of 2)]
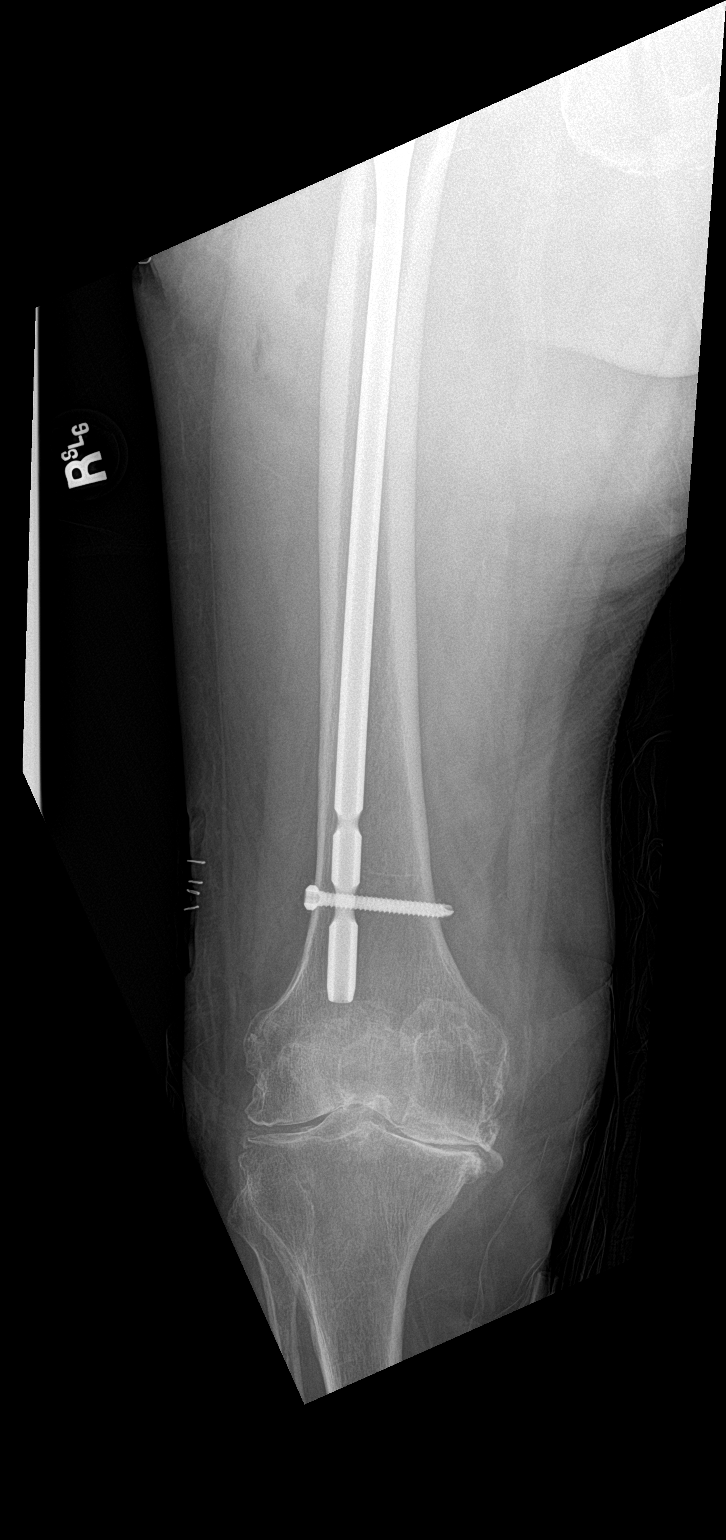

[femur lat (1 of 2)]
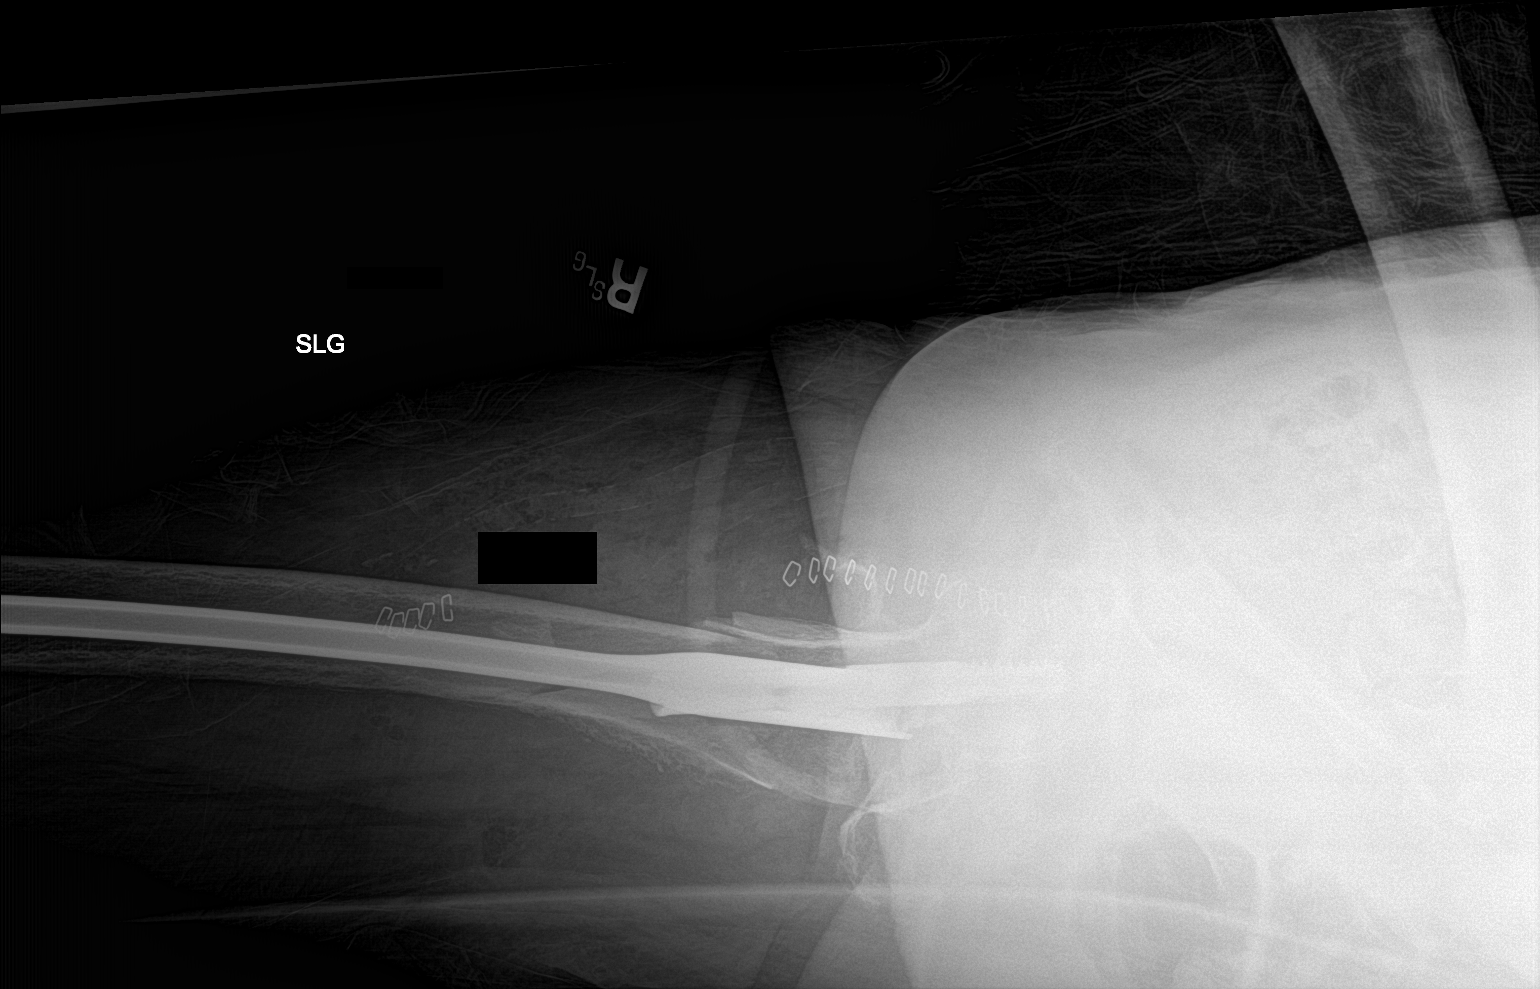

[femur lat (2 of 2)]
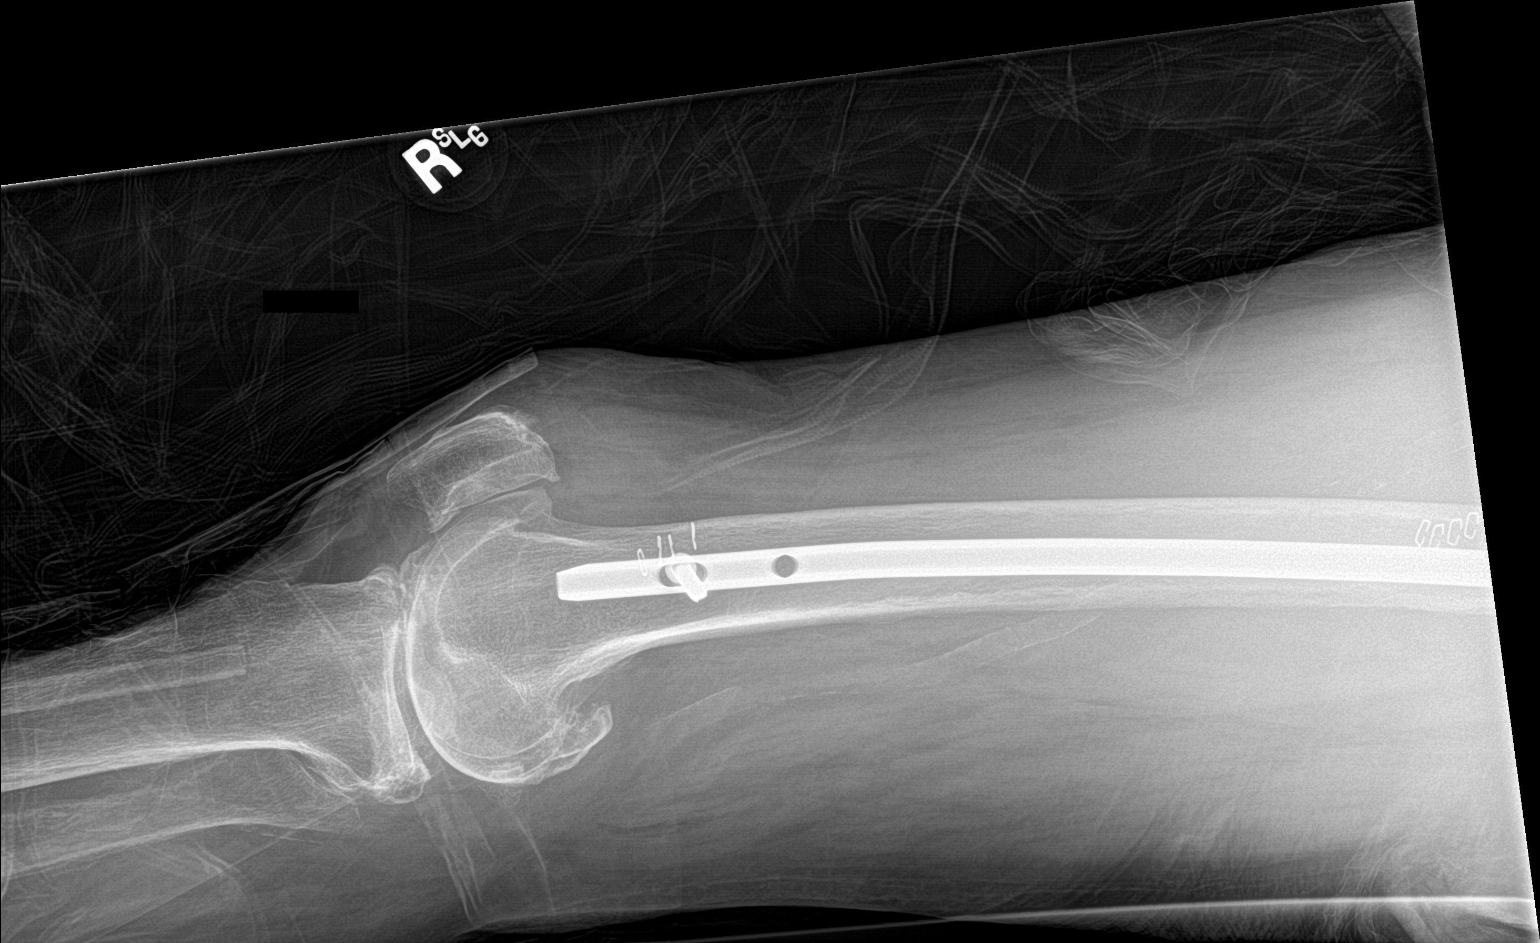

[4 of 4 positions shown; findings below may reference images not displayed]

FINDINGS: Fluoroscopic intraoperative spot films and postoperative radiographs
demonstrate a long intramedullary gamma nail in the femur with a
proximal dynamic hip screw and a single distal interlocking screw.
The dynamic hip screw appears to be in the fracture. There is
approximately slightly more than 1 cortex width of medial
displacement.
IMPRESSION: Internal fixation of comminuted intertrochanteric fracture.

## 2020-12-12 IMAGING — XA DG HIP (WITH PELVIS) OPERATIVE*R*
4 series · 4 of 4 positions shown · non-contrast
Comparison: Radiographs 10/21/2019

CLINICAL DATA: Right hip fracture.

EXAM:
OPERATIVE right HIP (WITH PELVIS IF PERFORMED) 4 VIEWS,
POSTOPERATIVE VIEWS OF THE RIGHT HIP.
TECHNIQUE: Fluoroscopic spot image(s) were submitted for interpretation
post-operatively.

[Series 33: cont. · 1 of 1 slices shown (1 of 4)]
[im 1/1]
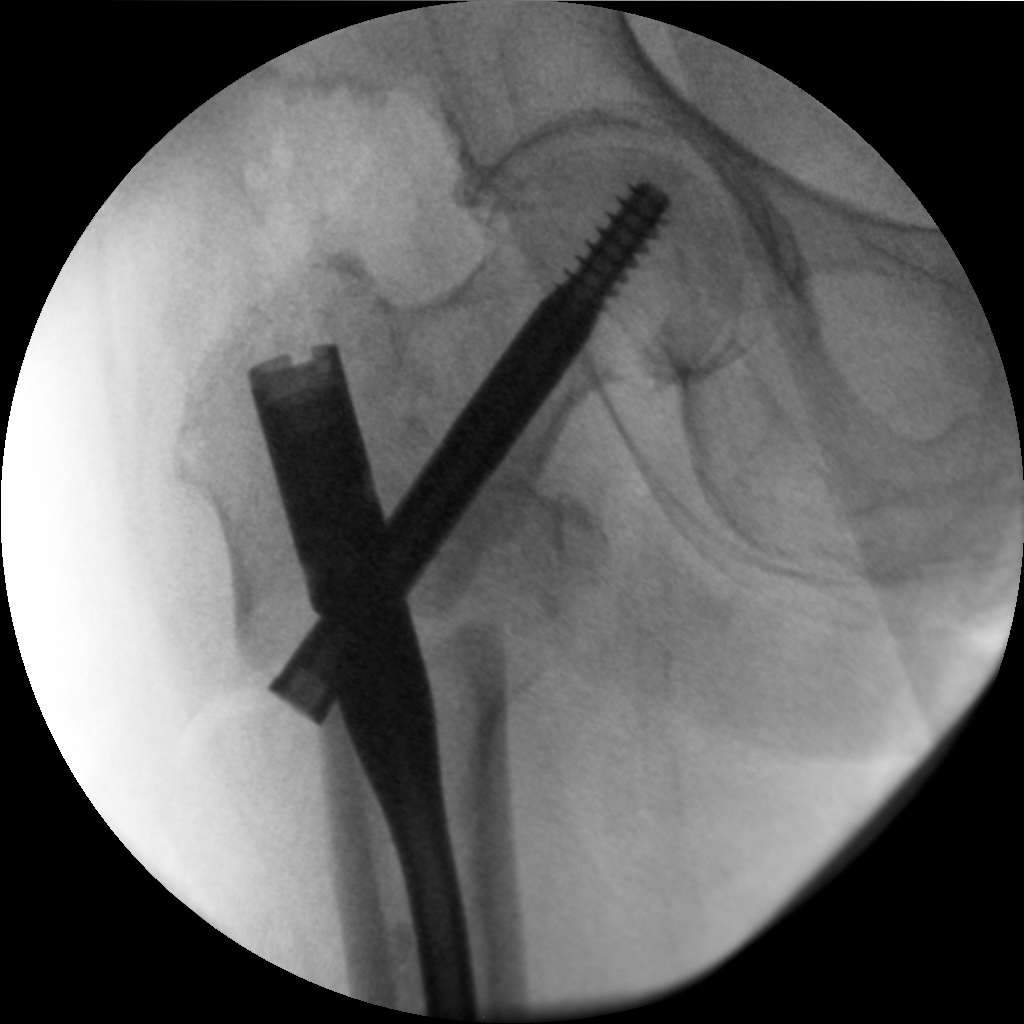

[Series 34: cont. · 1 of 1 slices shown (2 of 4)]
[im 1/1]
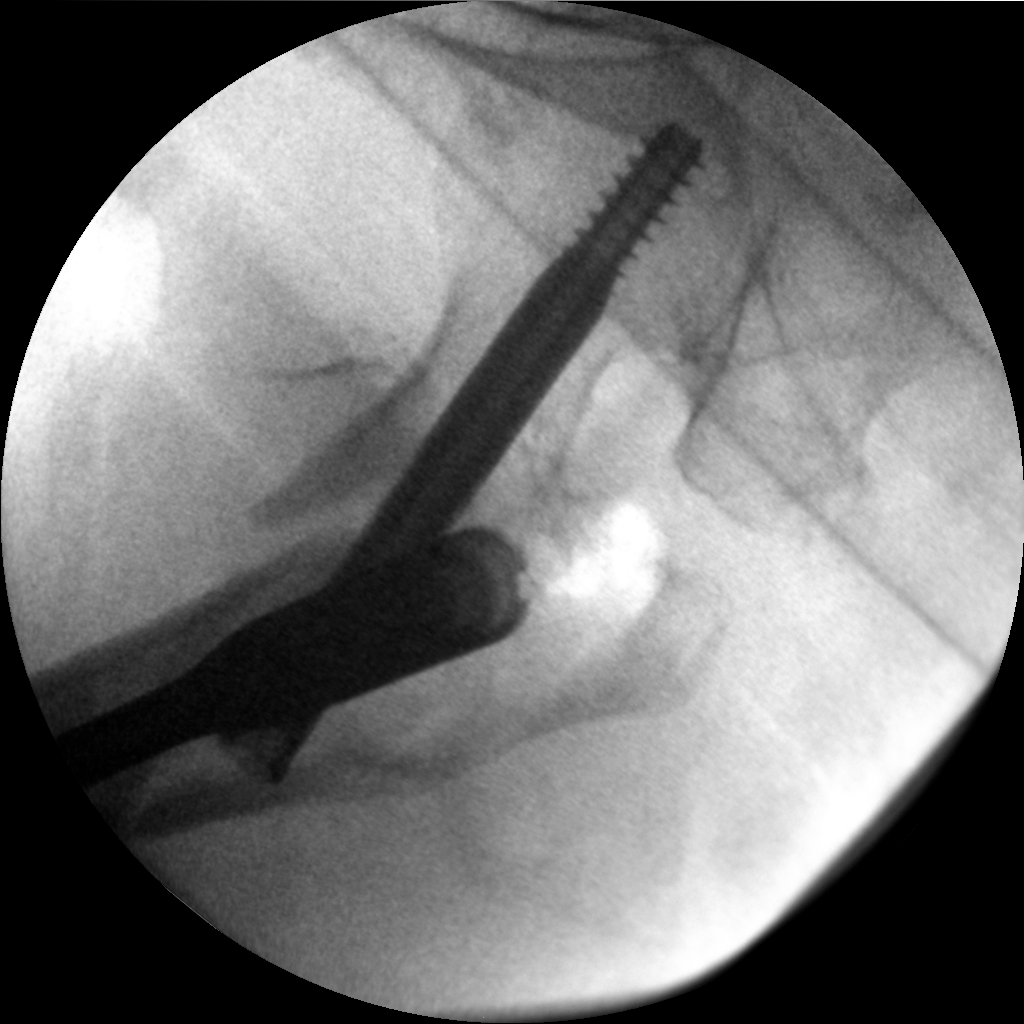

[Series 35: cont. · 1 of 1 slices shown (3 of 4)]
[im 1/1]
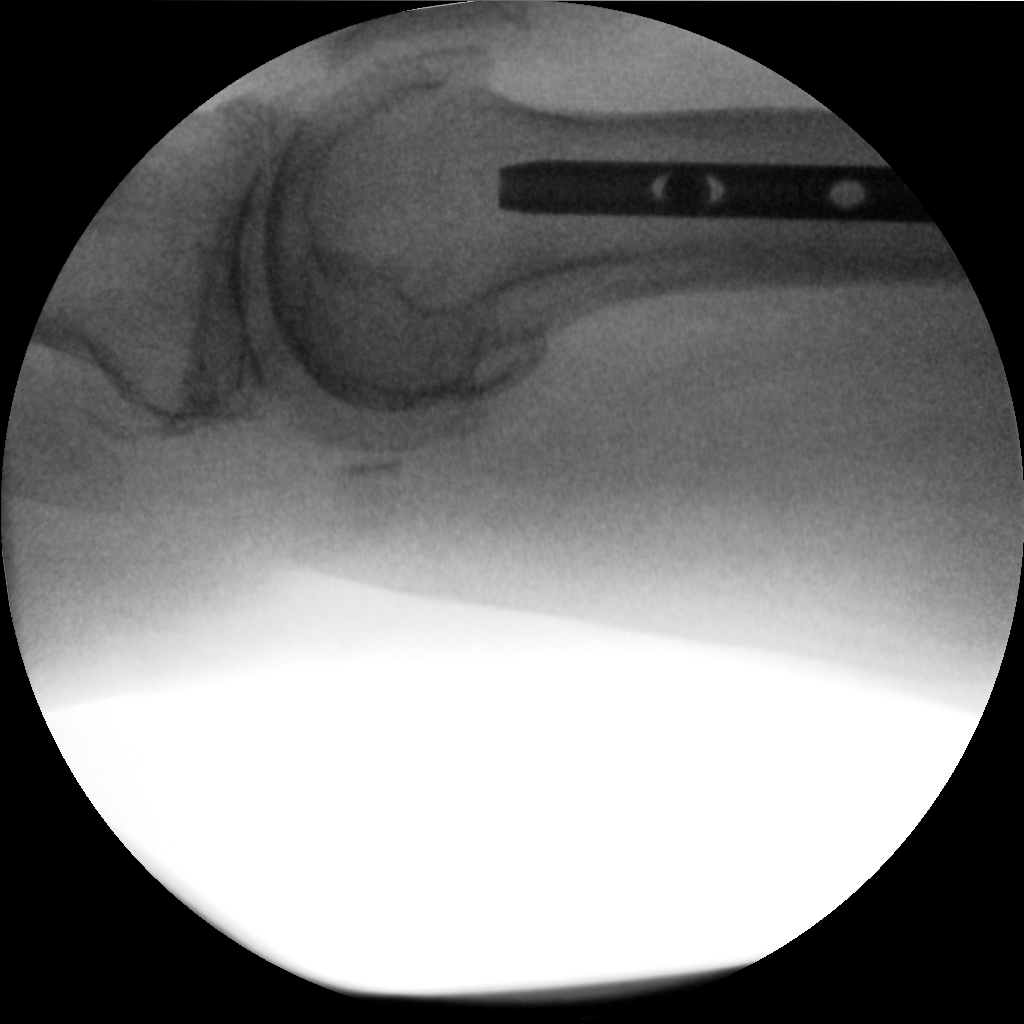

[Series 36: cont. · 1 of 1 slices shown (4 of 4)]
[im 1/1]
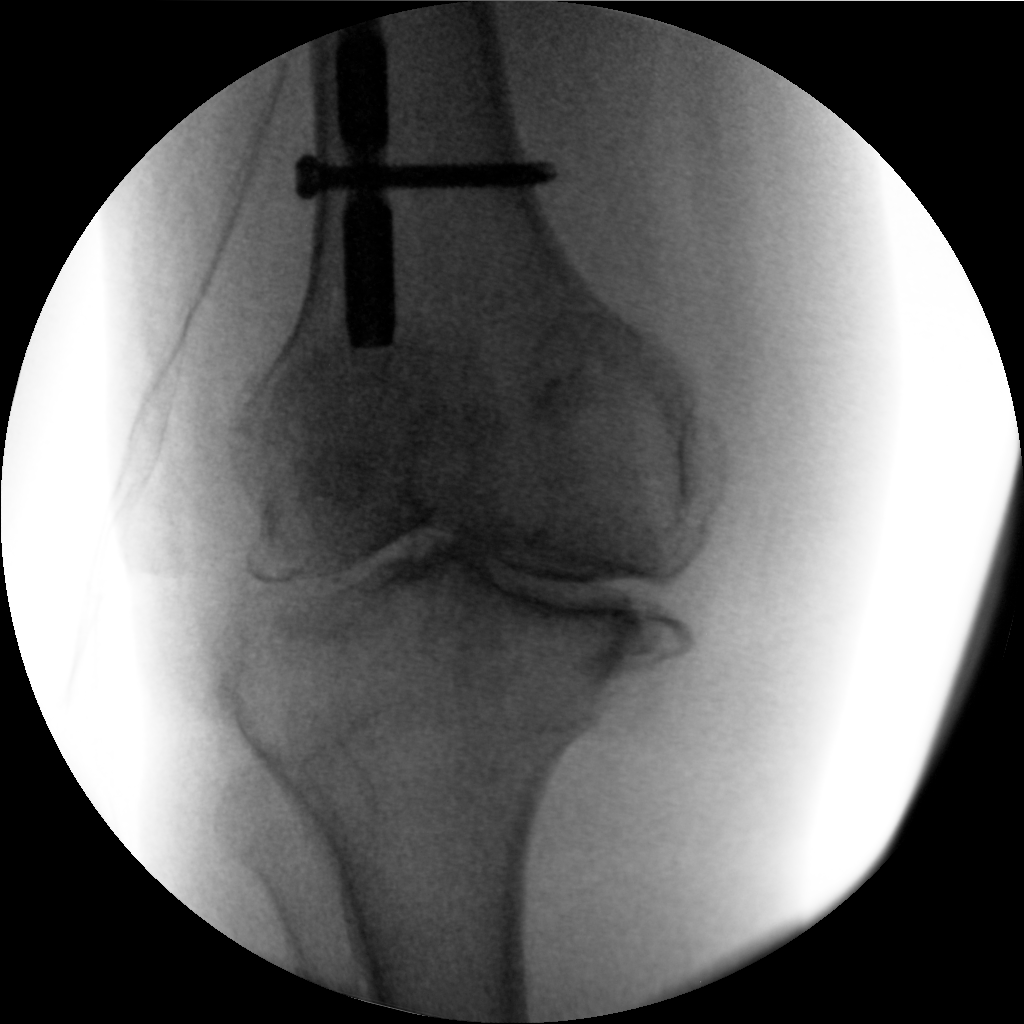

[4 of 4 positions shown; findings below may reference images not displayed]

FINDINGS: Fluoroscopic intraoperative spot films and postoperative radiographs
demonstrate a long intramedullary gamma nail in the femur with a
proximal dynamic hip screw and a single distal interlocking screw.
The dynamic hip screw appears to be in the fracture. There is
approximately slightly more than 1 cortex width of medial
displacement.
IMPRESSION: Internal fixation of comminuted intertrochanteric fracture.

## 2020-12-15 IMAGING — DX DG CHEST 1V PORT
1 series · 1 of 1 positions shown · non-contrast
Comparison: 10/21/2019

CLINICAL DATA: Fevers

EXAM:
PORTABLE CHEST 1 VIEW

[chest ap]
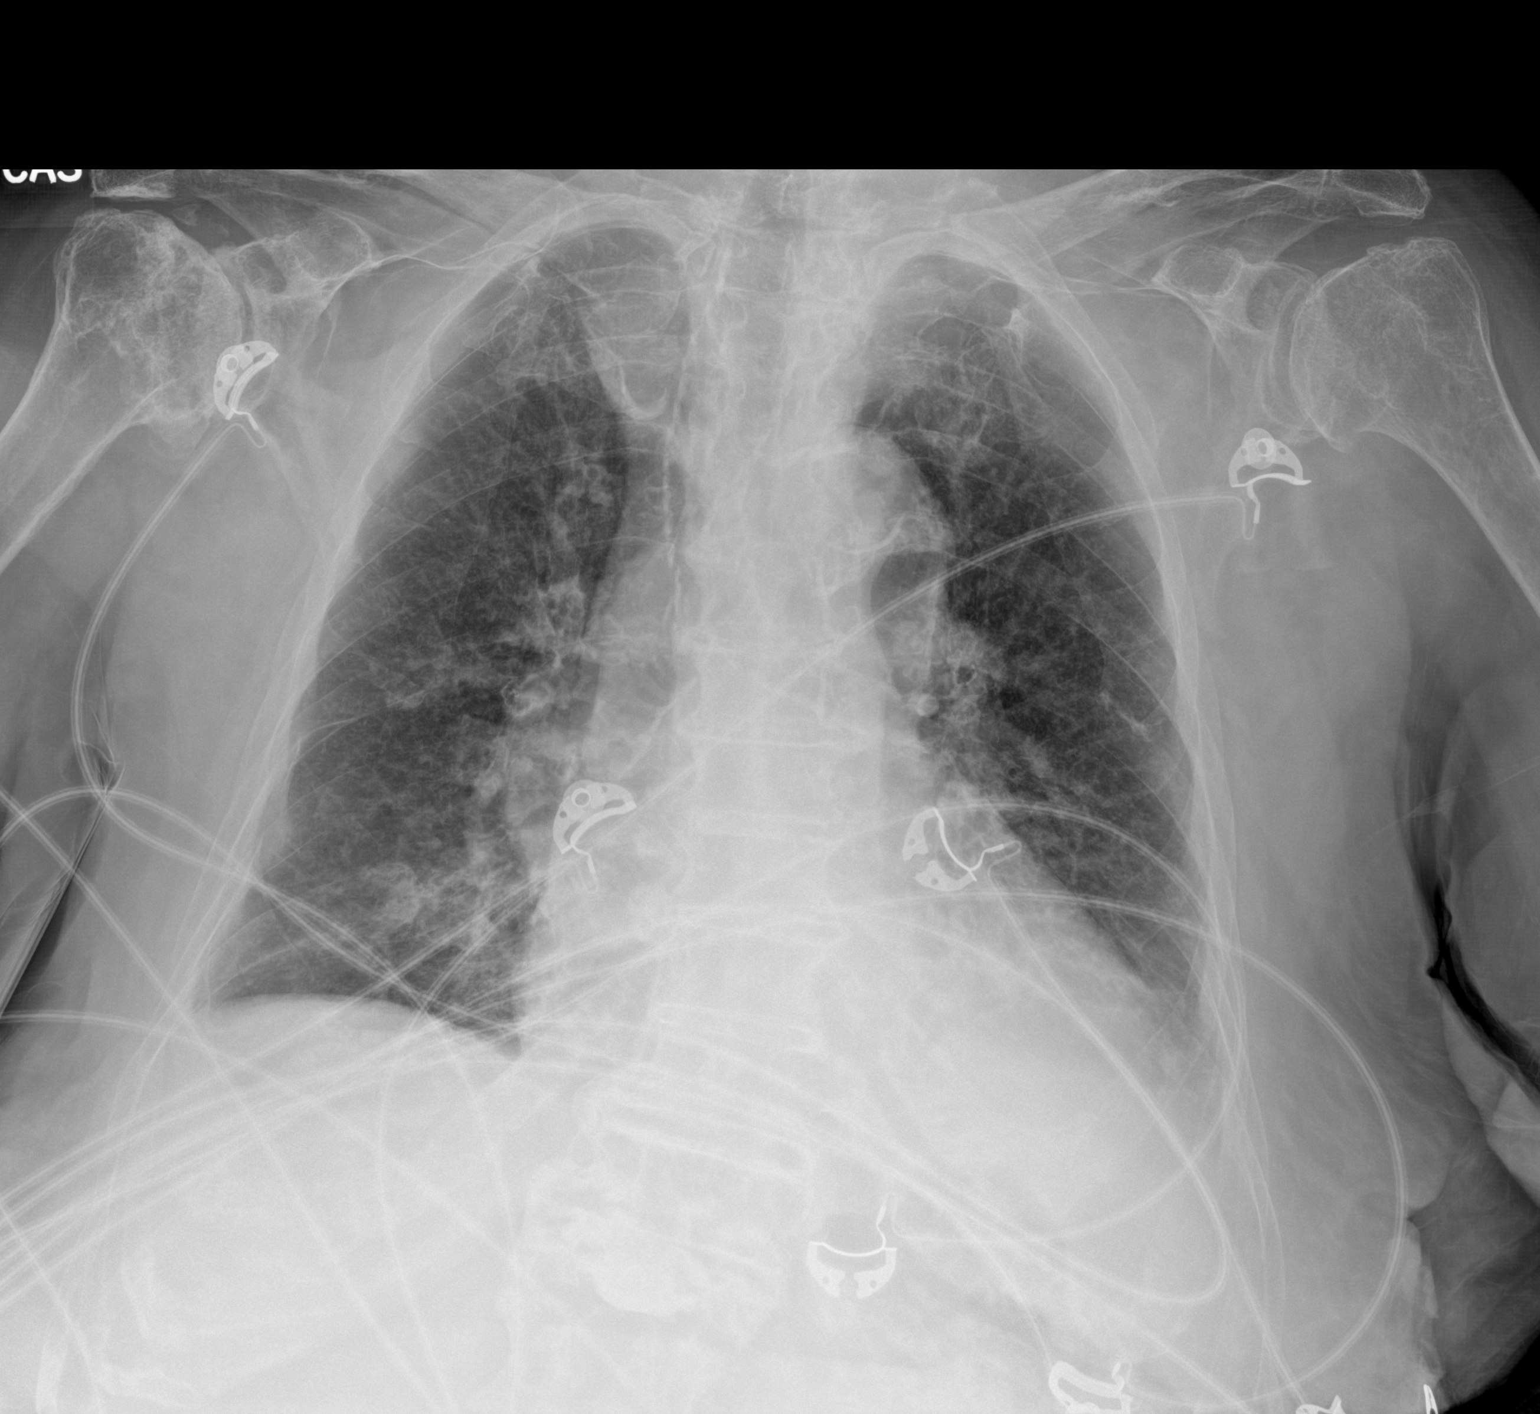

[1 of 1 positions shown; findings below may reference images not displayed]

FINDINGS: Cardiac shadow is stable. Aortic calcifications are again seen. The
lungs are well aerated bilaterally. No focal infiltrate is seen.
Minimal blunting of left costophrenic angle is noted. Degenerative
changes of the shoulder joints and thoracic spine are noted.
IMPRESSION: Minimal left basilar effusion.  No other focal abnormality is noted.
